# Patient Record
Sex: Female | Born: 1995 | Race: White | Hispanic: No | Marital: Single | Smoking: Never smoker
Health system: Southern US, Community
[De-identification: ages and names within clinical notes are randomized; demographics above are authoritative.]

## PROBLEM LIST (undated history)

## (undated) ENCOUNTER — Ambulatory Visit (HOSPITAL_COMMUNITY): Disposition: A | Discharge status: Home / Self Care | Payer: BLUE CROSS/BLUE SHIELD

---

## 2016-09-07 ENCOUNTER — Emergency Department (HOSPITAL_COMMUNITY)
Admission: EM | Admit: 2016-09-07 | Discharge: 2016-09-07 | Disposition: A | Discharge status: Home / Self Care | Payer: Self-pay | Attending: Emergency Medicine | Admitting: Emergency Medicine

## 2016-09-07 ENCOUNTER — Encounter (HOSPITAL_COMMUNITY): Payer: Self-pay | Admitting: Emergency Medicine

## 2016-09-07 DIAGNOSIS — H66004 Acute suppurative otitis media without spontaneous rupture of ear drum, recurrent, right ear: Secondary | ICD-10-CM | POA: Insufficient documentation

## 2016-09-07 DIAGNOSIS — J069 Acute upper respiratory infection, unspecified: Secondary | ICD-10-CM | POA: Insufficient documentation

## 2016-09-07 MED ORDER — AMOXICILLIN-POT CLAVULANATE 875-125 MG PO TABS
1.0000 | ORAL_TABLET | Freq: Once | ORAL | Status: AC
  Administered 2016-09-07: 1 via ORAL
  Filled 2016-09-07: qty 1

## 2016-09-07 MED ORDER — AMOXICILLIN-POT CLAVULANATE 875-125 MG PO TABS: 1.0000 | ORAL_TABLET | Freq: Two times a day (BID) | ORAL | 0 refills | Status: DC

## 2016-09-07 MED ORDER — PSEUDOEPHEDRINE HCL ER 120 MG PO TB12: 120.0000 mg | ORAL_TABLET | Freq: Two times a day (BID) | ORAL | Status: DC

## 2016-09-07 MED ORDER — OXYCODONE-ACETAMINOPHEN 5-325 MG PO TABS
1.0000 | ORAL_TABLET | Freq: Once | ORAL | Status: AC
  Administered 2016-09-07: 1 via ORAL
  Filled 2016-09-07: qty 1

## 2016-09-07 MED ORDER — OXYCODONE-ACETAMINOPHEN 5-325 MG PO TABS: 1.0000 | ORAL_TABLET | Freq: Four times a day (QID) | ORAL | 0 refills | Status: DC | PRN

## 2016-09-07 MED ORDER — PSEUDOEPHEDRINE HCL ER 120 MG PO TB12: 120.0000 mg | ORAL_TABLET | Freq: Two times a day (BID) | ORAL | 0 refills | Status: DC

## 2016-09-07 NOTE — Discharge Instructions (Signed)
Take the antibiotics as directed until all consumed  Take the decongestant for the next 2 days  You can use tylenol or Motin for mild to moderate pain and the Percocet for sever pain for the next several days

## 2016-09-07 NOTE — ED Provider Notes (Signed)
[facility]-EMERGENCY DEPT Provider Note   CSN: [id number] Arrival date & time: 09/07/16  0308     History   Chief Complaint Chief Complaint  Patient presents with  . Otalgia  . URI    HPI Brianna Holland is a 20 y.o. female.  Patient has had URI for the past week now with R ear pain that woke her from sleep Has Hx Otitis as an adult       History reviewed. No pertinent past medical history.  There are no active problems to display for this patient.   History reviewed. No pertinent surgical history.  OB History    No data available       Home Medications    Prior to Admission medications   Medication Sig Start Date End Date Taking? Authorizing Provider  amoxicillin-clavulanate (AUGMENTIN) 875-125 MG tablet Take 1 tablet by mouth every 12 (twelve) hours. 09/07/16   Earley FavorGail Teal Raben, NP  oxyCODONE-acetaminophen (PERCOCET/ROXICET) 5-325 MG tablet Take 1 tablet by mouth every 6 (six) hours as needed for severe pain. 09/07/16   Earley FavorGail Trenton Verne, NP  pseudoephedrine (SUDAFED 12 HOUR) 120 MG 12 hr tablet Take 1 tablet (120 mg total) by mouth 2 (two) times daily. 09/07/16   Earley FavorGail Syriah Delisi, NP    Family History No family history on file.  Social History Social History  Substance Use Topics  . Smoking status: Never Smoker  . Smokeless tobacco: Never Used  . Alcohol use No     Allergies   Review of patient's allergies indicates no known allergies.   Review of Systems Review of Systems  Constitutional: Negative for chills and fever.  HENT: Positive for congestion, ear pain and rhinorrhea. Negative for ear discharge and facial swelling.   Respiratory: Negative for cough.   All other systems reviewed and are negative.    Physical Exam Updated Vital Signs BP 142/85 (BP Location: Left Arm)   Pulse 70   Temp 98.7 F (37.1 C) (Oral)   Resp 18   SpO2 99%   Physical Exam  Constitutional: She appears well-developed and well-nourished.  HENT:  Head: Normocephalic.  Right Ear:  External ear normal. A middle ear effusion is present.  Left Ear: Hearing, tympanic membrane, external ear and ear canal normal.  Eyes: Pupils are equal, round, and reactive to light.  Neck: Normal range of motion.  Cardiovascular: Normal rate.   Pulmonary/Chest: Effort normal.  Musculoskeletal: Normal range of motion.  Lymphadenopathy:    She has cervical adenopathy.  Neurological: She is alert.  Skin: Skin is warm.  Psychiatric: She has a normal mood and affect.  Nursing note and vitals reviewed.    ED Treatments / Results  Labs (all labs ordered are listed, but only abnormal results are displayed) Labs Reviewed - No data to display  EKG  EKG Interpretation None       Radiology No results found.  Procedures Procedures (including critical care time)  Medications Ordered in ED Medications  pseudoephedrine (SUDAFED) 12 hr tablet 120 mg (not administered)  amoxicillin-clavulanate (AUGMENTIN) 875-125 MG per tablet 1 tablet (not administered)  oxyCODONE-acetaminophen (PERCOCET/ROXICET) 5-325 MG per tablet 1 tablet (not administered)     Initial Impression / Assessment and Plan / ED Course  I have reviewed the triage vital signs and the nursing notes.  Pertinent labs & imaging results that were available during my care of the patient were reviewed by me and considered in my medical decision making (see chart for details).  Clinical Course  Will treat with Augmentin Sudafed and 1 day of Percocet   Final Clinical Impressions(s) / ED Diagnoses   Final diagnoses:  Recurrent acute suppurative otitis media of right ear without spontaneous rupture of tympanic membrane  URI (upper respiratory infection)    New Prescriptions New Prescriptions   AMOXICILLIN-CLAVULANATE (AUGMENTIN) 875-125 MG TABLET    Take 1 tablet by mouth every 12 (twelve) hours.   OXYCODONE-ACETAMINOPHEN (PERCOCET/ROXICET) 5-325 MG TABLET    Take 1 tablet by mouth every 6 (six) hours as needed  for severe pain.   PSEUDOEPHEDRINE (SUDAFED 12 HOUR) 120 MG 12 HR TABLET    Take 1 tablet (120 mg total) by mouth 2 (two) times daily.     Earley Favor, NP 09/07/16 0352    Earley Favor, NP 09/07/16 1610    Cathren Laine, MD 09/07/16 [phone]

## 2016-09-07 NOTE — ED Triage Notes (Signed)
Patient here with complaints of right sided ear pain that woke her up out of sleep. Upper respiratory infection all week, otc medications no relief.

## 2017-03-19 ENCOUNTER — Ambulatory Visit (INDEPENDENT_AMBULATORY_CARE_PROVIDER_SITE_OTHER): Payer: BLUE CROSS/BLUE SHIELD | Admitting: Physician Assistant

## 2017-03-19 ENCOUNTER — Ambulatory Visit: Payer: Self-pay

## 2017-03-19 VITALS — BP 111/71 | HR 85 | Temp 98.1°F | Resp 16 | Wt 176.6 lb

## 2017-03-19 DIAGNOSIS — H6690 Otitis media, unspecified, unspecified ear: Secondary | ICD-10-CM

## 2017-03-19 DIAGNOSIS — R8299 Other abnormal findings in urine: Secondary | ICD-10-CM | POA: Diagnosis not present

## 2017-03-19 DIAGNOSIS — R42 Dizziness and giddiness: Secondary | ICD-10-CM

## 2017-03-19 DIAGNOSIS — J309 Allergic rhinitis, unspecified: Secondary | ICD-10-CM | POA: Diagnosis not present

## 2017-03-19 DIAGNOSIS — R82998 Other abnormal findings in urine: Secondary | ICD-10-CM

## 2017-03-19 LAB — POCT URINALYSIS DIP (MANUAL ENTRY)
BILIRUBIN UA: NEGATIVE
Bilirubin, UA: NEGATIVE
Blood, UA: NEGATIVE
Glucose, UA: NEGATIVE
NITRITE UA: NEGATIVE
PH UA: 7 (ref 5.0–8.0)
PROTEIN UA: NEGATIVE
Spec Grav, UA: 1.02 (ref 1.030–1.035)
UROBILINOGEN UA: 0.2 (ref ?–2.0)

## 2017-03-19 LAB — POCT CBC
Granulocyte percent: 75.9 %G (ref 37–80)
HEMATOCRIT: 36.4 % — AB (ref 37.7–47.9)
HEMOGLOBIN: 12.7 g/dL (ref 12.2–16.2)
LYMPH, POC: 1.7 (ref 0.6–3.4)
MCH, POC: 31.4 pg — AB (ref 27–31.2)
MCHC: 34.8 g/dL (ref 31.8–35.4)
MCV: 90.3 fL (ref 80–97)
MID (cbc): 0.6 (ref 0–0.9)
MPV: 7.6 fL (ref 0–99.8)
PLATELET COUNT, POC: 282 10*3/uL (ref 142–424)
POC Granulocyte: 7.2 — AB (ref 2–6.9)
POC LYMPH %: 18.3 % (ref 10–50)
POC MID %: 5.8 %M (ref 0–12)
RBC: 4.03 M/uL — AB (ref 4.04–5.48)
RDW, POC: 13.3 %
WBC: 9.5 10*3/uL (ref 4.6–10.2)

## 2017-03-19 LAB — POC MICROSCOPIC URINALYSIS (UMFC): MUCUS RE: ABSENT

## 2017-03-19 MED ORDER — AMOXICILLIN-POT CLAVULANATE 875-125 MG PO TABS: 1.0000 | ORAL_TABLET | Freq: Two times a day (BID) | ORAL | 0 refills | Status: DC

## 2017-03-19 MED ORDER — CETIRIZINE HCL 10 MG PO CAPS: 10.0000 mg | ORAL_CAPSULE | Freq: Every day | ORAL | 3 refills | Status: AC

## 2017-03-19 MED ORDER — FLUTICASONE PROPIONATE 50 MCG/ACT NA SUSP: 2.0000 | Freq: Every day | NASAL | 6 refills | Status: DC

## 2017-03-19 NOTE — Progress Notes (Signed)
MRN: [id number] DOB: 05/15/1996  Subjective:   Brianna Holland is a 21 y.o. healthy female presenting for chief complaint of Ear Pain (right ear pain and pressure x a week ) and Migraine (passed out in class ) .  Reports 3 day history of right ear pain. Has been dealing with bilateral ear pressure for the past month since she had a diagnosis of strep but notes the pressure got more severe in the right 3 days ago She would also like to discuss an episode where she felt like she was going to pass out after class.  Notes yesterday she got up from class and she felt sick on her stomach, she was dizzy, and then had a moment of blurred vision, nausea, palpitations, and ringing in her ears. After sitting for about 5 minutes, it completely resolved. She reports she had not drank or eaten a full meal prior to the event.  Has not had an episode since. She does have a headache now. Has not tried anything for relief of earache and headache.  She has history of seasonal allergies but is currently out of her allergy medication, no history of asthma. Has hx of ear infections. No hx of syncopal episodess Denies smoking. Pt notes she just had full blood work done at her gyn 1 month ago and her blood count, thyroid, and all other basic labs were completely normal. No FH of heart disease.  Review of Systems  Constitutional: Negative for chills, diaphoresis and fever.  HENT: Negative for sore throat.   Respiratory: Negative for cough and shortness of breath.   Cardiovascular: Negative for chest pain, palpitations and leg swelling.  Genitourinary: Positive for dysuria and frequency. Negative for flank pain, hematuria and urgency.  Neurological: Negative for tingling, sensory change, speech change, focal weakness and loss of consciousness.    Brianna Holland has a current medication list which includes the following prescription(s): norethindrone, amoxicillin-clavulanate, cetirizine hcl, and fluticasone. Also has No Known  Allergies.  Brianna Holland  has no past medical history on file. Also  has no past surgical history on file.     Social History   Social History  . Marital status: Single    Spouse name: N/A  . Number of children: N/A  . Years of education: N/A   Occupational History  . Not on file.   Social History Main Topics  . Smoking status: Never Smoker  . Smokeless tobacco: Never Used  . Alcohol use No  . Drug use: Unknown  . Sexual activity: Not on file   Other Topics Concern  . Not on file   Social History Narrative  . No narrative on file    Objective:   Vitals: BP 111/71   Pulse 85   Temp 98.1 F (36.7 C) (Oral)   Resp 16   Wt 176 lb 9.6 oz (80.1 kg)   LMP 02/28/2017   SpO2 100%   Physical Exam  Constitutional: She is oriented to person, place, and time. She appears well-developed and well-nourished. No distress.  HENT:  Head: Normocephalic and atraumatic.  Right Ear: External ear and ear canal normal. No mastoid tenderness. Tympanic membrane is erythematous and bulging.  Left Ear: External ear and ear canal normal. Tympanic membrane is not bulging. A middle ear effusion is present.  Nose: Mucosal edema (bilaterally) present. Right sinus exhibits no maxillary sinus tenderness and no frontal sinus tenderness. Left sinus exhibits no maxillary sinus tenderness and no frontal sinus tenderness.  Mouth/Throat: Uvula is midline,  oropharynx is clear and moist and mucous membranes are normal. No tonsillar exudate.  Eyes: Conjunctivae and EOM are normal. Pupils are equal, round, and reactive to light.  Neck: Normal range of motion.  Cardiovascular: Normal rate, regular rhythm and normal heart sounds.  Exam reveals no gallop and no friction rub.   No murmur heard. Pulmonary/Chest: Effort normal and breath sounds normal.  Neurological: She is alert and oriented to person, place, and time. She has normal strength. She displays no tremor. No cranial nerve deficit or sensory deficit. She  displays a negative Romberg sign. Coordination and gait normal.  Reflex Scores:      Tricep reflexes are 2+ on the right side and 2+ on the left side.      Bicep reflexes are 2+ on the right side and 2+ on the left side.      Brachioradialis reflexes are 2+ on the right side and 2+ on the left side.      Patellar reflexes are 2+ on the right side and 2+ on the left side.      Achilles reflexes are 2+ on the right side and 2+ on the left side. Normal FNF test.  Negative Dix Hallpike.   Skin: Skin is warm and dry.  Psychiatric: She has a normal mood and affect.  Vitals reviewed.  Orthostatic VS for the past 24 hrs:  BP- Lying Pulse- Lying BP- Sitting Pulse- Sitting BP- Standing at 0 minutes Pulse- Standing at 0 minutes  03/19/17 1552 115/72 84 119/75 103 119/77 101     Results for orders placed or performed in visit on 03/19/17 (from the past 24 hour(s))  POCT CBC     Status: Abnormal   Collection Time: 03/19/17  4:27 PM  Result Value Ref Range   WBC 9.5 4.6 - 10.2 K/uL   Lymph, poc 1.7 0.6 - 3.4   POC LYMPH PERCENT 18.3 10 - 50 %L   MID (cbc) 0.6 0 - 0.9   POC MID % 5.8 0 - 12 %M   POC Granulocyte 7.2 (A) 2 - 6.9   Granulocyte percent 75.9 37 - 80 %G   RBC 4.03 (A) 4.04 - 5.48 M/uL   Hemoglobin 12.7 12.2 - 16.2 g/dL   HCT, POC 40.9 (A) 81.1 - 47.9 %   MCV 90.3 80 - 97 fL   MCH, POC 31.4 (A) 27 - 31.2 pg   MCHC 34.8 31.8 - 35.4 g/dL   RDW, POC 91.4 %   Platelet Count, POC 282 142 - 424 K/uL   MPV 7.6 0 - 99.8 fL  POCT urinalysis dipstick     Status: Abnormal   Collection Time: 03/19/17  4:32 PM  Result Value Ref Range   Color, UA yellow yellow   Clarity, UA clear clear   Glucose, UA negative negative   Bilirubin, UA negative negative   Ketones, POC UA negative negative   Spec Grav, UA 1.020 1.030 - 1.035   Blood, UA negative negative   pH, UA 7.0 5.0 - 8.0   Protein Ur, POC negative negative   Urobilinogen, UA 0.2 Negative - 2.0   Nitrite, UA Negative Negative    Leukocytes, UA large (3+) (A) Negative  POCT Microscopic Urinalysis ([facility])     Status: Abnormal   Collection Time: 03/19/17  4:41 PM  Result Value Ref Range   WBC,UR,HPF,POC Few (A) None WBC/hpf   RBC,UR,HPF,POC None None RBC/hpf   Bacteria Many (A) None, Too numerous to count  Mucus Absent Absent   Epithelial Cells, UR Per Microscopy Moderate (A) None, Too numerous to count cells/hpf   EKG shows NSR at 88 bpm. No acute ST or T wave changes.   Assessment and Plan :  1. Dizziness PE findings, EKG, and POC labs reassuring. Pt likely experienced near syncopal episode due to mix of ear pressure, dehydration, and empty stomach. Instructed to return back to clinic if she experiences another one of these episodes for further evaluation. Encouraged to drink at least 64 oz of water daily and eat at least 3 meals throughout the day. Given strict ED/return precautions.  - Orthostatic vital signs - EKG 12-Lead - POCT CBC - POCT urinalysis dipstick - POCT Microscopic Urinalysis ([facility])  2. Acute otitis media, unspecified otitis media type - amoxicillin-clavulanate (AUGMENTIN) 875-125 MG tablet; Take 1 tablet by mouth 2 (two) times daily.  Dispense: 20 tablet; Refill: 0  3. Leukocytes in urine Urine culture pending. May have to change/add antibiotic to pt's regimen depending on urine culture results.  - Urine culture  4. Allergic rhinitis, unspecified chronicity, unspecified seasonality, unspecified trigger - Cetirizine HCl (ZYRTEC ALLERGY) 10 MG CAPS; Take 1 capsule (10 mg total) by mouth daily.  Dispense: 90 capsule; Refill: 3 - fluticasone (FLONASE) 50 MCG/ACT nasal spray; Place 2 sprays into both nostrils daily.  Dispense: 16 g; Refill: 6  Benjiman Core, PA-C  [facility] 03/19/2017 4:43 PM

## 2017-03-19 NOTE — Patient Instructions (Addendum)
Your physical exam findings are reassuring today. It appears you are having an ear infection which is likely the root cause for your symptoms. For ear infection, I would like you to take augmentin for the next ten days. Also, you should use flonase and zyrtec daily to help reduce the amount of fluid in your ear. I also think your episode yesterday could have partially been related to this and being slightly dehydrated/not having had eaten a real meal. Please make sure you are getting in at least 64 oz of water a day. Also, try to eat at least 3 meals daily. If you have any other episodes like this please return immediately or go to the ED.Thank you for letting me participate in your health and well being.   Your labs also suggest you may be having a UTI as well. I have sent off a culture and will call you in the next couple days in case the antibiotic I am prescribing for your ear infection does not cover the urine organism.    Near-Syncope Near-syncope is when you suddenly get weak or dizzy, or you feel like you might pass out (faint). During an episode of near-syncope, you may:  Feel dizzy or light-headed.  Feel sick to your stomach (nauseous).  See all white or all black.  Have cold, clammy skin. If you passed out, get help right away.Call your local emergency services (911 in the [place] Do not drive yourself to the hospital. Follow these instructions at home: Pay attention to any changes in your symptoms. Take these actions to help with your condition:  Have someone stay with you until you feel stable.  Do not drive, use machinery, or play sports until your doctor says it is okay.  Keep all follow-up visits as told by your doctor. This is important.  If you start to feel like you might pass out, lie down right away and raise (elevate) your feet above the level of your heart. Breathe deeply and steadily. Wait until all of the symptoms are gone.  Drink enough fluid to keep your pee  (urine) clear or pale yellow.  If you are taking blood pressure or heart medicine, get up slowly and spend many minutes getting ready to sit and then stand. This can help with dizziness.  Take over-the-counter and prescription medicines only as told by your doctor. Get help right away if:  You have a very bad headache.  You have unusual pain in your chest, tummy, or back.  You are bleeding from your mouth or rectum.  You have black or tarry poop (stool).  You have a very fast or uneven heartbeat (palpitations).  You pass out one time or more than once.  You have jerky movements that you cannot control (seizure).  You are confused.  You have trouble walking.  You are very weak.  You have vision problems. These symptoms may be an emergency. Do not wait to see if the symptoms will go away. Get medical help right away. Call your local emergency services (911 in the [place] Do not drive yourself to the hospital. This information is not intended to replace advice given to you by your health care provider. Make sure you discuss any questions you have with your health care provider. Document Released: 05/19/2008 Document Revised: 05/08/2016 Document Reviewed: 08/15/2015 Elsevier Interactive Patient Education  2017 [employer]  Otitis Media, Adult Otitis media is redness, soreness, and puffiness (swelling) in the space just behind your eardrum (middle ear).  It may be caused by allergies or infection. It often happens along with a cold. Follow these instructions at home:  Take your medicine as told. Finish it even if you start to feel better.  Only take over-the-counter or prescription medicines for pain, discomfort, or fever as told by your doctor.  Follow up with your doctor as told. Contact a doctor if:  You have otitis media only in one ear, or bleeding from your nose, or both.  You notice a lump on your neck.  You are not getting better in 3-5 days.  You feel worse  instead of better. Get help right away if:  You have pain that is not helped with medicine.  You have puffiness, redness, or pain around your ear.  You get a stiff neck.  You cannot move part of your face (paralysis).  You notice that the bone behind your ear hurts when you touch it. This information is not intended to replace advice given to you by your health care provider. Make sure you discuss any questions you have with your health care provider. Document Released: 05/19/2008 Document Revised: 05/08/2016 Document Reviewed: 06/28/2013 Elsevier Interactive Patient Education  2017 [employer].    IF you received an x-ray today, you will receive an invoice from [facility]. Please contact [facility] at [phone] with questions or concerns regarding your invoice.   IF you received labwork today, you will receive an invoice from [facility]. Please contact [facility] at [phone] with questions or concerns regarding your invoice.   Our billing staff will not be able to assist you with questions regarding bills from these companies.  You will be contacted with the lab results as soon as they are available. The fastest way to get your results is to activate your My Chart account. Instructions are located on the last page of this paperwork. If you have not heard from [place] regarding the results in 2 weeks, please contact this office.

## 2017-03-20 LAB — URINE CULTURE

## 2017-11-12 ENCOUNTER — Other Ambulatory Visit: Payer: Self-pay

## 2017-11-12 ENCOUNTER — Encounter (HOSPITAL_COMMUNITY): Payer: Self-pay | Admitting: Emergency Medicine

## 2017-11-12 DIAGNOSIS — E663 Overweight: Secondary | ICD-10-CM | POA: Diagnosis not present

## 2017-11-12 DIAGNOSIS — R740 Nonspecific elevation of levels of transaminase and lactic acid dehydrogenase [LDH]: Secondary | ICD-10-CM | POA: Insufficient documentation

## 2017-11-12 DIAGNOSIS — K8010 Calculus of gallbladder with chronic cholecystitis without obstruction: Secondary | ICD-10-CM | POA: Insufficient documentation

## 2017-11-12 DIAGNOSIS — Z8042 Family history of malignant neoplasm of prostate: Secondary | ICD-10-CM | POA: Diagnosis not present

## 2017-11-12 DIAGNOSIS — Z8379 Family history of other diseases of the digestive system: Secondary | ICD-10-CM | POA: Diagnosis not present

## 2017-11-12 DIAGNOSIS — Z793 Long term (current) use of hormonal contraceptives: Secondary | ICD-10-CM | POA: Diagnosis not present

## 2017-11-12 DIAGNOSIS — Z6825 Body mass index (BMI) 25.0-25.9, adult: Secondary | ICD-10-CM | POA: Insufficient documentation

## 2017-11-12 DIAGNOSIS — K219 Gastro-esophageal reflux disease without esophagitis: Secondary | ICD-10-CM | POA: Diagnosis not present

## 2017-11-12 DIAGNOSIS — Z79899 Other long term (current) drug therapy: Secondary | ICD-10-CM | POA: Diagnosis not present

## 2017-11-12 DIAGNOSIS — K8020 Calculus of gallbladder without cholecystitis without obstruction: Secondary | ICD-10-CM | POA: Diagnosis present

## 2017-11-12 DIAGNOSIS — Z8249 Family history of ischemic heart disease and other diseases of the circulatory system: Secondary | ICD-10-CM | POA: Diagnosis not present

## 2017-11-12 DIAGNOSIS — K838 Other specified diseases of biliary tract: Secondary | ICD-10-CM | POA: Diagnosis present

## 2017-11-12 LAB — CBC
HCT: 38.1 % (ref 36.0–46.0)
Hemoglobin: 12.8 g/dL (ref 12.0–15.0)
MCH: 30.3 pg (ref 26.0–34.0)
MCHC: 33.6 g/dL (ref 30.0–36.0)
MCV: 90.3 fL (ref 78.0–100.0)
PLATELETS: 267 10*3/uL (ref 150–400)
RBC: 4.22 MIL/uL (ref 3.87–5.11)
RDW: 12.2 % (ref 11.5–15.5)
WBC: 11.5 10*3/uL — AB (ref 4.0–10.5)

## 2017-11-12 LAB — COMPREHENSIVE METABOLIC PANEL
ALK PHOS: 74 U/L (ref 38–126)
ALT: 272 U/L — AB (ref 14–54)
AST: 395 U/L — ABNORMAL HIGH (ref 15–41)
Albumin: 4.2 g/dL (ref 3.5–5.0)
Anion gap: 6 (ref 5–15)
BUN: 13 mg/dL (ref 6–20)
CALCIUM: 9.1 mg/dL (ref 8.9–10.3)
CO2: 26 mmol/L (ref 22–32)
CREATININE: 0.66 mg/dL (ref 0.44–1.00)
Chloride: 104 mmol/L (ref 101–111)
Glucose, Bld: 115 mg/dL — ABNORMAL HIGH (ref 65–99)
Potassium: 3.6 mmol/L (ref 3.5–5.1)
Sodium: 136 mmol/L (ref 135–145)
TOTAL PROTEIN: 7.3 g/dL (ref 6.5–8.1)
Total Bilirubin: 1.7 mg/dL — ABNORMAL HIGH (ref 0.3–1.2)

## 2017-11-12 LAB — I-STAT BETA HCG BLOOD, ED (MC, WL, AP ONLY)

## 2017-11-12 LAB — LIPASE, BLOOD: LIPASE: 22 U/L (ref 11–51)

## 2017-11-12 NOTE — ED Triage Notes (Signed)
Pt is c/o epigastric pain that radiates through to her back between her shoulder blades Pt states it started about 2 pm today  Pt states she ate a salad for lunch  Pt states she has had the same in the past but it has not lasted this long  Pt states the pain is worse with movement and cough

## 2017-11-13 ENCOUNTER — Inpatient Hospital Stay (HOSPITAL_COMMUNITY): Payer: BLUE CROSS/BLUE SHIELD

## 2017-11-13 ENCOUNTER — Encounter (HOSPITAL_COMMUNITY)
Admission: EM | Disposition: A | Discharge status: Home / Self Care | Payer: Self-pay | Source: Home / Self Care | Attending: Emergency Medicine

## 2017-11-13 ENCOUNTER — Inpatient Hospital Stay (HOSPITAL_COMMUNITY): Payer: BLUE CROSS/BLUE SHIELD | Admitting: Registered Nurse

## 2017-11-13 ENCOUNTER — Ambulatory Visit (HOSPITAL_COMMUNITY)
Admission: EM | Admit: 2017-11-13 | Discharge: 2017-11-14 | Disposition: A | Discharge status: Home / Self Care | Payer: BLUE CROSS/BLUE SHIELD | Attending: Surgery | Admitting: Surgery

## 2017-11-13 ENCOUNTER — Emergency Department (HOSPITAL_COMMUNITY): Payer: BLUE CROSS/BLUE SHIELD

## 2017-11-13 DIAGNOSIS — K810 Acute cholecystitis: Secondary | ICD-10-CM | POA: Diagnosis not present

## 2017-11-13 DIAGNOSIS — R945 Abnormal results of liver function studies: Secondary | ICD-10-CM | POA: Diagnosis not present

## 2017-11-13 DIAGNOSIS — R935 Abnormal findings on diagnostic imaging of other abdominal regions, including retroperitoneum: Secondary | ICD-10-CM

## 2017-11-13 DIAGNOSIS — K8050 Calculus of bile duct without cholangitis or cholecystitis without obstruction: Secondary | ICD-10-CM

## 2017-11-13 DIAGNOSIS — K838 Other specified diseases of biliary tract: Secondary | ICD-10-CM

## 2017-11-13 DIAGNOSIS — R7401 Elevation of levels of liver transaminase levels: Secondary | ICD-10-CM | POA: Diagnosis present

## 2017-11-13 DIAGNOSIS — E663 Overweight: Secondary | ICD-10-CM | POA: Diagnosis present

## 2017-11-13 DIAGNOSIS — K219 Gastro-esophageal reflux disease without esophagitis: Secondary | ICD-10-CM | POA: Diagnosis not present

## 2017-11-13 DIAGNOSIS — R740 Nonspecific elevation of levels of transaminase and lactic acid dehydrogenase [LDH]: Secondary | ICD-10-CM

## 2017-11-13 DIAGNOSIS — K8020 Calculus of gallbladder without cholecystitis without obstruction: Secondary | ICD-10-CM

## 2017-11-13 DIAGNOSIS — R1013 Epigastric pain: Secondary | ICD-10-CM

## 2017-11-13 DIAGNOSIS — R109 Unspecified abdominal pain: Secondary | ICD-10-CM | POA: Diagnosis present

## 2017-11-13 HISTORY — PX: CHOLECYSTECTOMY: SHX55

## 2017-11-13 LAB — COMPREHENSIVE METABOLIC PANEL
ALBUMIN: 3.8 g/dL (ref 3.5–5.0)
ALT: 717 U/L — ABNORMAL HIGH (ref 14–54)
ANION GAP: 7 (ref 5–15)
AST: 728 U/L — ABNORMAL HIGH (ref 15–41)
Alkaline Phosphatase: 93 U/L (ref 38–126)
BILIRUBIN TOTAL: 2.5 mg/dL — AB (ref 0.3–1.2)
BUN: 9 mg/dL (ref 6–20)
CO2: 25 mmol/L (ref 22–32)
Calcium: 8.6 mg/dL — ABNORMAL LOW (ref 8.9–10.3)
Chloride: 107 mmol/L (ref 101–111)
Creatinine, Ser: 0.65 mg/dL (ref 0.44–1.00)
GFR calc non Af Amer: >60 mL/min (ref >60)
GLUCOSE: 86 mg/dL (ref 65–99)
POTASSIUM: 3.6 mmol/L (ref 3.5–5.1)
SODIUM: 139 mmol/L (ref 135–145)
TOTAL PROTEIN: 6.7 g/dL (ref 6.5–8.1)

## 2017-11-13 LAB — URINALYSIS, ROUTINE W REFLEX MICROSCOPIC
Bilirubin Urine: NEGATIVE
Glucose, UA: NEGATIVE mg/dL
Hgb urine dipstick: NEGATIVE
KETONES UR: NEGATIVE mg/dL
LEUKOCYTES UA: NEGATIVE
NITRITE: NEGATIVE
PH: 6 (ref 5.0–8.0)
Protein, ur: NEGATIVE mg/dL
SPECIFIC GRAVITY, URINE: 1.012 (ref 1.005–1.030)

## 2017-11-13 SURGERY — LAPAROSCOPIC CHOLECYSTECTOMY
Anesthesia: General

## 2017-11-13 MED ORDER — MIDAZOLAM HCL 5 MG/5ML IJ SOLN
INTRAMUSCULAR | Status: DC | PRN
  Administered 2017-11-13: 2 mg via INTRAVENOUS

## 2017-11-13 MED ORDER — FENTANYL CITRATE (PF) 100 MCG/2ML IJ SOLN
INTRAMUSCULAR | Status: AC
  Filled 2017-11-13: qty 4

## 2017-11-13 MED ORDER — HYDROMORPHONE HCL 1 MG/ML IJ SOLN
0.2500 mg | INTRAMUSCULAR | Status: DC | PRN
  Administered 2017-11-13 (×2): 0.5 mg via INTRAVENOUS

## 2017-11-13 MED ORDER — LIDOCAINE 2% (20 MG/ML) 5 ML SYRINGE
INTRAMUSCULAR | Status: AC
  Filled 2017-11-13: qty 5

## 2017-11-13 MED ORDER — FENTANYL CITRATE (PF) 250 MCG/5ML IJ SOLN
INTRAMUSCULAR | Status: AC
Start: 2017-11-13 — End: 2017-11-13
  Filled 2017-11-13: qty 5

## 2017-11-13 MED ORDER — HYDROCODONE-ACETAMINOPHEN 5-325 MG PO TABS
1.0000 | ORAL_TABLET | ORAL | Status: DC | PRN
Start: 2017-11-13 — End: 2017-11-14
  Administered 2017-11-14: 1 via ORAL
  Filled 2017-11-13: qty 1

## 2017-11-13 MED ORDER — DEXTROSE 5 % IV SOLN
INTRAVENOUS | Status: AC
  Filled 2017-11-13: qty 10

## 2017-11-13 MED ORDER — MORPHINE SULFATE (PF) 2 MG/ML IV SOLN
2.0000 mg | INTRAVENOUS | Status: DC | PRN
  Administered 2017-11-13 – 2017-11-14 (×3): 2 mg via INTRAVENOUS
  Filled 2017-11-13 (×3): qty 1

## 2017-11-13 MED ORDER — ROCURONIUM BROMIDE 50 MG/5ML IV SOSY
PREFILLED_SYRINGE | INTRAVENOUS | Status: AC
  Filled 2017-11-13: qty 5

## 2017-11-13 MED ORDER — LACTATED RINGERS IR SOLN
Status: DC | PRN
  Administered 2017-11-13: 1000 mL

## 2017-11-13 MED ORDER — FENTANYL CITRATE (PF) 100 MCG/2ML IJ SOLN
INTRAMUSCULAR | Status: DC | PRN
  Administered 2017-11-13 (×5): 50 ug via INTRAVENOUS

## 2017-11-13 MED ORDER — DEXTROSE 5 % IV SOLN
INTRAVENOUS | Status: AC
  Filled 2017-11-13: qty 2

## 2017-11-13 MED ORDER — ONDANSETRON HCL 4 MG/2ML IJ SOLN
INTRAMUSCULAR | Status: AC
  Filled 2017-11-13: qty 2

## 2017-11-13 MED ORDER — PROPOFOL 10 MG/ML IV BOLUS
INTRAVENOUS | Status: AC
  Filled 2017-11-13: qty 20

## 2017-11-13 MED ORDER — MIDAZOLAM HCL 2 MG/2ML IJ SOLN
INTRAMUSCULAR | Status: AC
  Filled 2017-11-13: qty 2

## 2017-11-13 MED ORDER — ONDANSETRON HCL 4 MG/2ML IJ SOLN: 4.0000 mg | Freq: Three times a day (TID) | INTRAMUSCULAR | Status: DC | PRN

## 2017-11-13 MED ORDER — DEXAMETHASONE SODIUM PHOSPHATE 10 MG/ML IJ SOLN
INTRAMUSCULAR | Status: DC | PRN
  Administered 2017-11-13: 10 mg via INTRAVENOUS

## 2017-11-13 MED ORDER — SODIUM CHLORIDE 0.9 % IV SOLN
INTRAVENOUS | Status: DC
  Administered 2017-11-13: 07:00:00 via INTRAVENOUS

## 2017-11-13 MED ORDER — BUPIVACAINE-EPINEPHRINE 0.25% -1:200000 IJ SOLN
INTRAMUSCULAR | Status: AC
  Filled 2017-11-13: qty 1

## 2017-11-13 MED ORDER — HYDROMORPHONE HCL 1 MG/ML IJ SOLN
INTRAMUSCULAR | Status: AC
  Filled 2017-11-13: qty 1

## 2017-11-13 MED ORDER — BUPIVACAINE-EPINEPHRINE 0.25% -1:200000 IJ SOLN
INTRAMUSCULAR | Status: DC | PRN
  Administered 2017-11-13: 50 mL

## 2017-11-13 MED ORDER — ONDANSETRON HCL 4 MG/2ML IJ SOLN
INTRAMUSCULAR | Status: DC | PRN
  Administered 2017-11-13: 4 mg via INTRAVENOUS

## 2017-11-13 MED ORDER — LACTATED RINGERS IV SOLN
INTRAVENOUS | Status: DC | PRN
  Administered 2017-11-13 (×2): via INTRAVENOUS

## 2017-11-13 MED ORDER — IOPAMIDOL (ISOVUE-300) INJECTION 61%
INTRAVENOUS | Status: AC
  Filled 2017-11-13: qty 50

## 2017-11-13 MED ORDER — FENTANYL CITRATE (PF) 100 MCG/2ML IJ SOLN
25.0000 ug | INTRAMUSCULAR | Status: DC | PRN
Start: 2017-11-13 — End: 2017-11-13
  Administered 2017-11-13: 50 ug via INTRAVENOUS
  Administered 2017-11-13 (×2): 25 ug via INTRAVENOUS

## 2017-11-13 MED ORDER — OXYCODONE HCL 5 MG PO TABS: 5.0000 mg | ORAL_TABLET | Freq: Once | ORAL | Status: DC | PRN

## 2017-11-13 MED ORDER — ONDANSETRON HCL 4 MG/2ML IJ SOLN
4.0000 mg | Freq: Once | INTRAMUSCULAR | Status: AC
  Administered 2017-11-13: 4 mg via INTRAVENOUS
  Filled 2017-11-13: qty 2

## 2017-11-13 MED ORDER — ROCURONIUM BROMIDE 10 MG/ML (PF) SYRINGE
PREFILLED_SYRINGE | INTRAVENOUS | Status: DC | PRN
  Administered 2017-11-13: 50 mg via INTRAVENOUS

## 2017-11-13 MED ORDER — MELATONIN 3 MG PO TABS: 3.0000 mg | ORAL_TABLET | Freq: Every evening | ORAL | Status: DC | PRN

## 2017-11-13 MED ORDER — EPHEDRINE SULFATE-NACL 50-0.9 MG/10ML-% IV SOSY
PREFILLED_SYRINGE | INTRAVENOUS | Status: DC | PRN
  Administered 2017-11-13: 5 mg via INTRAVENOUS

## 2017-11-13 MED ORDER — MORPHINE SULFATE (PF) 4 MG/ML IV SOLN
4.0000 mg | Freq: Once | INTRAVENOUS | Status: AC
  Administered 2017-11-13: 4 mg via INTRAVENOUS
  Filled 2017-11-13: qty 1

## 2017-11-13 MED ORDER — DEXTROSE 5 % IV SOLN
1.0000 g | INTRAVENOUS | Status: DC
  Administered 2017-11-13: 1 g via INTRAVENOUS
  Filled 2017-11-13 (×2): qty 10

## 2017-11-13 MED ORDER — OXYCODONE HCL 5 MG/5ML PO SOLN
5.0000 mg | Freq: Once | ORAL | Status: DC | PRN
  Filled 2017-11-13: qty 5

## 2017-11-13 MED ORDER — PROPOFOL 10 MG/ML IV BOLUS
INTRAVENOUS | Status: DC | PRN
  Administered 2017-11-13: 170 mg via INTRAVENOUS

## 2017-11-13 MED ORDER — LIDOCAINE 2% (20 MG/ML) 5 ML SYRINGE
INTRAMUSCULAR | Status: DC | PRN
  Administered 2017-11-13: 100 mg via INTRAVENOUS

## 2017-11-13 MED ORDER — 0.9 % SODIUM CHLORIDE (POUR BTL) OPTIME
TOPICAL | Status: DC | PRN
  Administered 2017-11-13: 1000 mL

## 2017-11-13 MED ORDER — DEXAMETHASONE SODIUM PHOSPHATE 10 MG/ML IJ SOLN
INTRAMUSCULAR | Status: AC
  Filled 2017-11-13: qty 1

## 2017-11-13 SURGICAL SUPPLY — 43 items
APPLIER CLIP ROT 10 11.4 M/L (STAPLE)
BANDAGE ADH SHEER 1  50/CT (GAUZE/BANDAGES/DRESSINGS) ×10 IMPLANT
BENZOIN TINCTURE PRP APPL 2/3 (GAUZE/BANDAGES/DRESSINGS) ×2 IMPLANT
CABLE HIGH FREQUENCY MONO STRZ (ELECTRODE) ×2 IMPLANT
CATH CHOLANG 76X19 KUMAR (CATHETERS) IMPLANT
CHLORAPREP W/TINT 26ML (MISCELLANEOUS) ×2 IMPLANT
CLIP APPLIE ROT 10 11.4 M/L (STAPLE) IMPLANT
CLIP VESOLOCK LG 6/CT PURPLE (CLIP) ×2 IMPLANT
CLIP VESOLOCK XL 6/CT (CLIP) IMPLANT
COVER MAYO STAND STRL (DRAPES) IMPLANT
COVER SURGICAL LIGHT HANDLE (MISCELLANEOUS) ×2 IMPLANT
DECANTER SPIKE VIAL GLASS SM (MISCELLANEOUS) ×2 IMPLANT
DERMABOND ADVANCED (GAUZE/BANDAGES/DRESSINGS) ×1
DERMABOND ADVANCED .7 DNX12 (GAUZE/BANDAGES/DRESSINGS) ×1 IMPLANT
DRAIN CHANNEL 19F RND (DRAIN) IMPLANT
DRAPE C-ARM 42X120 X-RAY (DRAPES) IMPLANT
EVACUATOR SILICONE 100CC (DRAIN) IMPLANT
GLOVE BIOGEL PI IND STRL 7.0 (GLOVE) ×1 IMPLANT
GLOVE BIOGEL PI INDICATOR 7.0 (GLOVE) ×1
GLOVE SURG SS PI 7.0 STRL IVOR (GLOVE) ×2 IMPLANT
GOWN STRL REUS W/TWL LRG LVL3 (GOWN DISPOSABLE) ×2 IMPLANT
GOWN STRL REUS W/TWL XL LVL3 (GOWN DISPOSABLE) ×4 IMPLANT
GRASPER SUT TROCAR 14GX15 (MISCELLANEOUS) ×2 IMPLANT
IRRIG SUCT STRYKERFLOW 2 WTIP (MISCELLANEOUS)
IRRIGATION SUCT STRKRFLW 2 WTP (MISCELLANEOUS) IMPLANT
KIT BASIN OR (CUSTOM PROCEDURE TRAY) ×2 IMPLANT
POUCH RETRIEVAL ECOSAC 10 (ENDOMECHANICALS) ×1 IMPLANT
POUCH RETRIEVAL ECOSAC 10MM (ENDOMECHANICALS) ×1
SCISSORS LAP 5X35 DISP (ENDOMECHANICALS) ×2 IMPLANT
SET IRRIG TUBING LAPAROSCOPIC (IRRIGATION / IRRIGATOR) ×2 IMPLANT
SHEARS HARMONIC ACE PLUS 36CM (ENDOMECHANICALS) IMPLANT
SLEEVE XCEL OPT CAN 5 100 (ENDOMECHANICALS) ×4 IMPLANT
STOPCOCK 4 WAY LG BORE MALE ST (IV SETS) IMPLANT
STRIP CLOSURE SKIN 1/2X4 (GAUZE/BANDAGES/DRESSINGS) ×2 IMPLANT
SUT ETHILON 2 0 PS N (SUTURE) IMPLANT
SUT MNCRL AB 4-0 PS2 18 (SUTURE) ×2 IMPLANT
SUT VICRYL 0 ENDOLOOP (SUTURE) IMPLANT
TOWEL OR 17X26 10 PK STRL BLUE (TOWEL DISPOSABLE) ×2 IMPLANT
TOWEL OR NON WOVEN STRL DISP B (DISPOSABLE) ×2 IMPLANT
TRAY LAPAROSCOPIC (CUSTOM PROCEDURE TRAY) ×2 IMPLANT
TROCAR BLADELESS OPT 5 100 (ENDOMECHANICALS) ×2 IMPLANT
TROCAR XCEL 12X100 BLDLESS (ENDOMECHANICALS) ×2 IMPLANT
TUBING INSUF HEATED (TUBING) ×2 IMPLANT

## 2017-11-13 NOTE — ED Provider Notes (Signed)
[facility]-EMERGENCY DEPT Provider Note   CSN: [id number] Arrival date & time: 11/12/17  2126     History   Chief Complaint Chief Complaint  Patient presents with  . Abdominal Pain    HPI Brianna Holland is a 21 y.o. female.  The history is provided by the patient and medical records.  Abdominal Pain   Associated symptoms include nausea and vomiting.    21 year old female with no significant past medical history presenting to the ED for abdominal pain.  Patient reports she had a salad today and felt like "it did not die just right".  States she made herself vomit about 6 times in an attempt to make herself feel better.  States she did take some aspirin and her dad gave her some antacids without relief.  States pain is in epigastric area and in her back.  Reports some continued nausea.  She has not had any diarrhea.  No prior abdominal surgeries.  States she has noticed some pain with eating so has been trying to modify her diet (eliminating fatty/greasy foods, etc).  Has never had evaluation for this in the past.  History reviewed. No pertinent past medical history.  There are no active problems to display for this patient.   History reviewed. No pertinent surgical history.  OB History    No data available       Home Medications    Prior to Admission medications   Medication Sig Start Date End Date Taking? Authorizing Provider  Cetirizine HCl (ZYRTEC ALLERGY) 10 MG CAPS Take 1 capsule (10 mg total) by mouth daily. 03/19/17  Yes Barnett AbuWiseman, [place] D, PA-C  MELATONIN PO Take 1 tablet by mouth at bedtime as needed (sleep).   Yes [provider]  norethindrone (MICRONOR,CAMILA,ERRIN) 0.35 MG tablet Take 1 tablet by mouth daily.   Yes [provider]    Family History Family History  Problem Relation Age of Onset  . Cancer Father        Prostate  . Hypertension Father     Social History Social History   Tobacco Use  . Smoking  status: Never Smoker  . Smokeless tobacco: Never Used  Substance Use Topics  . Alcohol use: No  . Drug use: No     Allergies   Patient has no known allergies.   Review of Systems Review of Systems  Gastrointestinal: Positive for abdominal pain, nausea and vomiting.  All other systems reviewed and are negative.    Physical Exam Updated Vital Signs BP 117/69 (BP Location: Left Arm)   Pulse 75   Temp 98.7 F (37.1 C) (Oral)   Resp 18   LMP 10/08/2017 (Exact Date)   SpO2 100%   Physical Exam  Constitutional: She is oriented to person, place, and time. She appears well-developed and well-nourished.  HENT:  Head: Normocephalic and atraumatic.  Mouth/Throat: Oropharynx is clear and moist.  Eyes: Conjunctivae and EOM are normal. Pupils are equal, round, and reactive to light.  Neck: Normal range of motion.  Cardiovascular: Normal rate, regular rhythm and normal heart sounds.  Pulmonary/Chest: Effort normal and breath sounds normal.  Abdominal: Soft. Bowel sounds are normal. She exhibits no pulsatile liver and no ascites. There is tenderness in the right upper quadrant and epigastric area. There is no rigidity and no guarding.  Epigastric and right upper quadrant tenderness with some radiation into the right flank, no peritoneal signs, normal bowel sounds  Musculoskeletal: Normal range of motion.  Neurological: She is  alert and oriented to person, place, and time.  Skin: Skin is warm and dry.  Psychiatric: She has a normal mood and affect.  Nursing note and vitals reviewed.    ED Treatments / Results  Labs (all labs ordered are listed, but only abnormal results are displayed) Labs Reviewed  COMPREHENSIVE METABOLIC PANEL - Abnormal; Notable for the following components:      Result Value   Glucose, Bld 115 (*)    AST 395 (*)    ALT 272 (*)    Total Bilirubin 1.7 (*)    All other components within normal limits  CBC - Abnormal; Notable for the following components:    WBC 11.5 (*)    All other components within normal limits  LIPASE, BLOOD  URINALYSIS, ROUTINE W REFLEX MICROSCOPIC  I-STAT BETA HCG BLOOD, ED ([facility], [facility], [facility] ONLY)    EKG  EKG Interpretation None       Radiology Koreas Abdomen Complete  Result Date: 11/13/2017 CLINICAL DATA:  Intermittent RIGHT upper quadrant pain for months. Elevated liver function tests. EXAM: ABDOMEN ULTRASOUND COMPLETE COMPARISON:  None. FINDINGS: Gallbladder: Multiple echogenic gallstones measuring to 14 mm with acoustic shadowing. No gallbladder wall thickening or pericholecystic fluid. No sonographic Murphy's sign elicited. Common bile duct: Diameter: 8 mm proximally, 5 mm distally. No sonographic findings of choledocholithiasis. Liver: No focal lesion identified. Within normal limits in parenchymal echogenicity. Portal vein is patent on color Doppler imaging with normal direction of blood flow towards the liver. IVC: No abnormality visualized. Pancreas: Visualized portion unremarkable. Spleen: Size and appearance within normal limits. Right Kidney: Length: 9.1 cm. Echogenicity within normal limits. No mass or hydronephrosis visualized. Left Kidney: Length: 9.8 cm. Echogenicity within normal limits. No mass or hydronephrosis visualized. Abdominal aorta: No aneurysm visualized. Other findings: None. IMPRESSION: 1. Cholelithiasis without sonographic findings of acute cholecystitis. 2. Dilated Common bile duct 8 mm proximally, associated with recently passed stones. Electronically Signed   By: Awilda Metroourtnay  Bloomer M.D.   On: 11/13/2017 04:33    Procedures Procedures (including critical care time)  Medications Ordered in ED Medications  morphine 4 MG/ML injection 4 mg (4 mg Intravenous Given 11/13/17 0424)  ondansetron (ZOFRAN) injection 4 mg (4 mg Intravenous Given 11/13/17 0331)     Initial Impression / Assessment and Plan / ED Course  I have reviewed the triage vital signs and the nursing notes.  Pertinent labs & imaging  results that were available during my care of the patient were reviewed by me and considered in my medical decision making (see chart for details).  21 year old female here with abdominal pain.  Reports this began after eating salad today.  Felt that she "did not digested right".  She is afebrile and nontoxic.  He does have some epigastric and mild right upper quadrant pain.  She also endorses pain straight through to her mid back.  Screening labs were obtained from triage with elevations of her AST and ALT at 395 and 272 respectively.  Bili also elevated at 1.7.  She denies any significant alcohol use, Tylenol intake, illicit drug use, or other risk factors for hepatitis C.  She has been vaccinated against hepatitis B. no recent travel out of the country, diarrhea.  She is not on any statin medications.  She does report that she has had some intermittent abdominal pain after eating for a while now and so has been modifying her diet.  She has never had this evaluated.  We will plan for abdominal ultrasound to  assess for gallstones.  After initial medications, patient feeling much better.  US shows cholelithiasis without findings of cholecystitis.  Dilated CBD up to 8mm without acute findings of choledocholithiasis-- question of recently passed stone.  Patient's overall appearance much improved.  Vitals remained stable.  She is otherwise healthy.  Will discuss with GI for recommendations of inpatient versus outpatient management.  Case discussed in detail with on-call GI physician, Dr. Rhea Belton-- appreciate his input.  Discussed options of inpatient versus outpatient management and risk/benefits of each (inpatient more rapid, outpatient can be quite lengthy)/  He recommends to discuss with patient-- he is ok with either option but patient will need to understand she was worsen clinically if following up OP.  He will see her in the hospital today if admitted, otherwise will see in clinic on Monday.   I have  discussed options with patient as outlined by Dr. Rhea Belton-- Patient initially wanted to go home, however after I discussed her options and risks, especially acutely worsening condition without ability to follow-up over the weekend, patient elected admission.  I feel like this is probably her safest option.  Will order MRCP.   Discussed with Dr. Clyde Lundborg-- accepts admission, temp orders placed.  Morning team to do full assessment.  Final Clinical Impressions(s) / ED Diagnoses   Final diagnoses:  Gallstones  Transaminitis  Common bile duct dilation    ED Discharge Orders    None       Garlon Hatchet, PA-C 11/13/17 0630    Zadie Rhine, MD 11/13/17 [id number]

## 2017-11-13 NOTE — Anesthesia Postprocedure Evaluation (Signed)
Anesthesia Post Note  Patient: Meriel FlavorsHolly Bellavance  Procedure(s) Performed: LAPAROSCOPIC CHOLECYSTECTOMY (N/A )     Patient location during evaluation: PACU Anesthesia Type: General Level of consciousness: awake and alert Pain management: pain level controlled Vital Signs Assessment: post-procedure vital signs reviewed and stable Respiratory status: spontaneous breathing, nonlabored ventilation, respiratory function stable and patient connected to nasal cannula oxygen Cardiovascular status: blood pressure returned to baseline and stable Postop Assessment: no apparent nausea or vomiting Anesthetic complications: no    Last Vitals:  Vitals:   11/13/17 1633 11/13/17 1730  BP: 124/86 121/80  Pulse: 70 (!) 55  Resp: 14 15  Temp: 37 C 37.1 C  SpO2: 100% 100%    Last Pain:  Vitals:   11/13/17 1730  TempSrc: Oral  PainSc:                  Taiana Temkin

## 2017-11-13 NOTE — H&P (Signed)
History and Physical  Brianna Holland WUJ:[id number] DOB: 06/22/1996 DOA: 11/13/2017  Referring physician:  Sharilyn Sites, ER PA PCP: Patient, No Pcp Per  Outpatient specialists: None Patient coming from: home & is able to ambulate without assistance  Chief Complaint: abdominal pain   HPI: Brianna Holland is a 21 y.o. female with medical history significant for GERD who for the past few months has been complaining of midepigastric and right upper quadrant abdominal pain which she feels goes all the way to her back. Her most recent episode started last night and then continued to get worse than usual to the point where it was a 10/10 she came into the emergency room. She also threw up approximately 6 times, almost always after eating. ED Course: in the emergency room, patient was noted to have mild transaminitis with an AST of 395 and ALT of 272. Alkaline phosphatase level was normal. White blood cell count minimally elevated at 11.5.  A right upper quadrant ultrasound noted cholelithiasis without sonographic findings of acute cholecystitis and also a dilated common bile duct associated with recently passed stones. Case was discussed with gastroenterology was consulted who recommended getting MRCP. Hospitalists were called for further evaluation and admission.   Follow-up labs several hours later noted worsening transaminitis with AST of 728 and ALT of 717.  bilirubin slightly increased from 1.7 up to 2.5. Formal consult by GI was done. MRCP was performed later that morning. MRCP noted a borderline distention of the common bile duct to 7 mm all of the common bile duct and head of the pancreas was normal at 3 mm. No obstructing stone noted. Again, cholelithiasis noted with these findings.  With these findings, general surgery who had been consulted by GI evaluated patient and plan to take her later today for laparoscopic cholecystectomy.  Review of Systems: Patient seafter arrival to floor . Pt complains  of feeling hungry. Also some mild right upper quadrant soreness   Pt denies anheadaches, vision changes, dysphagia, chest pain, palpitations, shortness of breath, wheeze, cough, abdominal pain, hematuria, dysuria, constipation, diarrhea, focal extremity numbness weakness or pain .  Review of systems are otherwise negative   History reviewed. No pertinent past medical history other than acid reflux. History reviewed. No pertinent surgical history.  Social History:  reports that  has never smoked. she has never used smokeless tobacco. She reports that she does not drink alcohol or use drugs.  when she is at school, she lives with 3 roommates. She ambulates without assistance.    No Known Allergies  Family History  Problem Relation Age of Onset  . Cancer Father        Prostate  . Hypertension Father    Mother and grandmother with history of gallstones status post cholecystectomy  Prior to Admission medications   Medication Sig Start Date End Date Taking? Authorizing Provider  Cetirizine HCl (ZYRTEC ALLERGY) 10 MG CAPS Take 1 capsule (10 mg total) by mouth daily. 03/19/17  Yes Barnett Abu, [place] D, PA-C  MELATONIN PO Take 1 tablet by mouth at bedtime as needed (sleep).   Yes [provider]  norethindrone (MICRONOR,CAMILA,ERRIN) 0.35 MG tablet Take 1 tablet by mouth daily.   Yes [provider]    Physical Exam: BP 105/62 (BP Location: Left Arm)   Pulse 60   Temp 97.7 F (36.5 C) (Oral)   Resp 16   Ht 5\' 4"  (1.626 m)   Wt 77.1 kg (170 lb)   LMP 10/08/2017 (Exact Date)   SpO2  100%   BMI 29.18 kg/m   General:  Alert and oriented 3, no acute distress  Eyes: sclera nonicteric, extraocular movements are intact  ENT:  Normocephalic, atraumatic, mucous membranes are slightly dry Neck: Supple, no JVD  Cardiovascular: regular rate and rhythm, S1-S2  Respiratory: clear auscultation bilaterally  Abdomen: soft, mild tenderness right upper quadrant, nondistended,  normoactive bowel sounds  Skin: No skin breaks, tears or lesions. Tattoo noted  Musculoskeletal: no clubbing or cyanosis or edema  Psychiatric: appropriate, no evidence of psychoses  Neurologic: no focal deficits           Labs on Admission:  Basic Metabolic Panel: Recent Labs  Lab 11/12/17 2221 11/13/17 0910  NA 136 139  K 3.6 3.6  CL 104 107  CO2 26 25  GLUCOSE 115* 86  BUN 13 9  CREATININE 0.66 0.65  CALCIUM 9.1 8.6*   Liver Function Tests: Recent Labs  Lab 11/12/17 2221 11/13/17 0910  AST 395* 728*  ALT 272* 717*  ALKPHOS 74 93  BILITOT 1.7* 2.5*  PROT 7.3 6.7  ALBUMIN 4.2 3.8   Recent Labs  Lab 11/12/17 2221  LIPASE 22   No results for input(s): AMMONIA in the last 168 hours. CBC: Recent Labs  Lab 11/12/17 2221  WBC 11.5*  HGB 12.8  HCT 38.1  MCV 90.3  PLT 267   Cardiac Enzymes: No results for input(s): CKTOTAL, CKMB, CKMBINDEX, TROPONINI in the last 168 hours.  BNP (last 3 results) No results for input(s): BNP in the last 8760 hours.  ProBNP (last 3 results) No results for input(s): PROBNP in the last 8760 hours.  CBG: No results for input(s): GLUCAP in the last 168 hours.  Radiological Exams on Admission: Koreas Abdomen Complete  Result Date: 11/13/2017 CLINICAL DATA:  Intermittent RIGHT upper quadrant pain for months. Elevated liver function tests. EXAM: ABDOMEN ULTRASOUND COMPLETE COMPARISON:  None. FINDINGS: Gallbladder: Multiple echogenic gallstones measuring to 14 mm with acoustic shadowing. No gallbladder wall thickening or pericholecystic fluid. No sonographic Murphy's sign elicited. Common bile duct: Diameter: 8 mm proximally, 5 mm distally. No sonographic findings of choledocholithiasis. Liver: No focal lesion identified. Within normal limits in parenchymal echogenicity. Portal vein is patent on color Doppler imaging with normal direction of blood flow towards the liver. IVC: No abnormality visualized. Pancreas: Visualized portion  unremarkable. Spleen: Size and appearance within normal limits. Right Kidney: Length: 9.1 cm. Echogenicity within normal limits. No mass or hydronephrosis visualized. Left Kidney: Length: 9.8 cm. Echogenicity within normal limits. No mass or hydronephrosis visualized. Abdominal aorta: No aneurysm visualized. Other findings: None. IMPRESSION: 1. Cholelithiasis without sonographic findings of acute cholecystitis. 2. Dilated Common bile duct 8 mm proximally, associated with recently passed stones. Electronically Signed   By: Awilda Metroourtnay  Bloomer M.D.   On: 11/13/2017 04:33   Mr Abdomen Mrcp Wo Contrast  Result Date: 11/13/2017 CLINICAL DATA:  Intermittent right upper quadrant pain with elevated LFTs. Cholelithiasis on ultrasound of biliary dilatation. EXAM: MRI ABDOMEN WITHOUT CONTRAST  (INCLUDING MRCP) TECHNIQUE: Multiplanar multisequence MR imaging of the abdomen was performed. Heavily T2-weighted images of the biliary and pancreatic ducts were obtained, and three-dimensional MRCP images were rendered by post processing. COMPARISON:  Ultrasound exam earlier the same day. FINDINGS: Lower chest:  Unremarkable. Hepatobiliary: Unremarkable noncontrast appearance. Gallbladder is distended with multiple stones evident ranging in size from 17 mm diameter down to stones in the fundal region measuring only a couple mm diameter. No gallbladder wall thickening or pericholecystic fluid. Slight prominence intrahepatic  bile ducts with extrahepatic common duct measuring 7 mm diameter. Common bile duct in the head of the pancreas measures 3 mm diameter. Pancreas: Unremarkable. Spleen: Normal. Adrenals/Urinary Tract: Adrenal glands unremarkable. Kidneys have normal noncontrast MR appearance. Stomach/Bowel: Stomach is nondistended. No gastric wall thickening. No evidence of outlet obstruction. Duodenum is normally positioned as is the ligament of Treitz. No small bowel or colonic dilatation within the visualized abdomen.  Vascular/Lymphatic: No abdominal aortic aneurysm. There is no gastrohepatic or hepatoduodenal ligament lymphadenopathy. No intraperitoneal or retroperitoneal lymphadenopathy. Other: No intraperitoneal free fluid. Musculoskeletal: No abnormal marrow signal within the visualized bony anatomy. IMPRESSION: 1. Cholelithiasis without choledocholithiasis. Common duct is borderline distended at 7 mm diameter although common bile duct diameter in the head of the pancreas is normal at 3 mm. Electronically Signed   By: Kennith Center M.D.   On: 11/13/2017 11:41   Mr 3d Recon At Scanner  Result Date: 11/13/2017 CLINICAL DATA:  Intermittent right upper quadrant pain with elevated LFTs. Cholelithiasis on ultrasound of biliary dilatation. EXAM: MRI ABDOMEN WITHOUT CONTRAST  (INCLUDING MRCP) TECHNIQUE: Multiplanar multisequence MR imaging of the abdomen was performed. Heavily T2-weighted images of the biliary and pancreatic ducts were obtained, and three-dimensional MRCP images were rendered by post processing. COMPARISON:  Ultrasound exam earlier the same day. FINDINGS: Lower chest:  Unremarkable. Hepatobiliary: Unremarkable noncontrast appearance. Gallbladder is distended with multiple stones evident ranging in size from 17 mm diameter down to stones in the fundal region measuring only a couple mm diameter. No gallbladder wall thickening or pericholecystic fluid. Slight prominence intrahepatic bile ducts with extrahepatic common duct measuring 7 mm diameter. Common bile duct in the head of the pancreas measures 3 mm diameter. Pancreas: Unremarkable. Spleen: Normal. Adrenals/Urinary Tract: Adrenal glands unremarkable. Kidneys have normal noncontrast MR appearance. Stomach/Bowel: Stomach is nondistended. No gastric wall thickening. No evidence of outlet obstruction. Duodenum is normally positioned as is the ligament of Treitz. No small bowel or colonic dilatation within the visualized abdomen. Vascular/Lymphatic: No abdominal  aortic aneurysm. There is no gastrohepatic or hepatoduodenal ligament lymphadenopathy. No intraperitoneal or retroperitoneal lymphadenopathy. Other: No intraperitoneal free fluid. Musculoskeletal: No abnormal marrow signal within the visualized bony anatomy. IMPRESSION: 1. Cholelithiasis without choledocholithiasis. Common duct is borderline distended at 7 mm diameter although common bile duct diameter in the head of the pancreas is normal at 3 mm. Electronically Signed   By: Kennith Center M.D.   On: 11/13/2017 11:41    EKG: Not done  Assessment/Plan Present on Admission: . Acute cholecystitis causing transaminitis, hyperbilirubinemia and abdominal pain: Common bile duct stone obstruction ruled out. Likely previously had stone which has since passed, confirmed by MRCP. 4 laparoscopic cholecystectomy later today. Discussed with general surgery and have requested they take over care following surgery. Marland Kitchen Overweight (BMI 25.0-29.9): Meets criteria with BMI greater than 25 . GERD (gastroesophageal reflux disease): Continue home medications once able to take by mouth  Principal Problem:   Acute cholecystitis Active Problems:   Abdominal pain   Transaminitis   Overweight (BMI 25.0-29.9)   GERD (gastroesophageal reflux disease)   DVT prophylaxis: SCDs   Code Status: Full code   Family Communication: Friend at the bedside   Disposition Plan: Surgery today, potential discharge this evening or tomorrow, dependent on general surgery   Consults called: Gen. surgery, [facility]   Admission status: Patient initially made inpatient given concerns for abdominal pain and workup    Hollice Espy MD [facility] Pager [phone]  If 7PM-7AM, please contact  night-coverage [email or web address] Password [email or web address]  11/13/2017, 12:42 PM

## 2017-11-13 NOTE — Consult Note (Signed)
[facility] Consult/Admission Note  Brianna Holland 11-19-1996  [id number].    Requesting MD: Dr. [place] Pink Chief Complaint/Reason for Consult: cholelithiasis   HPI:   Patient is an otherwise healthy 21 year old female who presented to the emergency department with complaints of abdominal pain, nausea and induced vomiting. Patient states she's been having intermittent severe, epigastric/chest pain radiating into her back since summer which has progressively worsened over the last 2 months. She can take a few aspirin and his pain usually goes away. Yesterday the pain did not resolve which prompted her to come to the ER. Patient states she induced vomiting due to nausea. She denies blood in her stool, light color stools, changes in bowel habits, dysuria, hematuria. She states associated shortness of breath with the epigastric/chest pain. Ultrasound showed cholelithiasis without findings of acute cholecystitis. Common bile duct dilatation 8 mm. AST and 728, ALT 717, total bilirubin 2.5. WBC 11.5. Afebrile. Patient states she does not smoke, use drugs, or drink alcohol. She has had no previous abdominal surgeries. She is not on a blood thinner.  ROS:  Review of Systems  Constitutional: Negative for chills and fever.  Respiratory: Positive for shortness of breath (with pain not at rest).   Cardiovascular: Positive for chest pain.  Gastrointestinal: Positive for abdominal pain, nausea and vomiting. Negative for blood in stool, constipation, diarrhea and melena.  Genitourinary: Negative for dysuria and hematuria.  Skin: Negative for rash.  Neurological: Negative for dizziness and loss of consciousness.  All other systems reviewed and are negative.    Family History  Problem Relation Age of Onset  . Cancer Father        Prostate  . Hypertension Father     History reviewed. No pertinent past medical history.  History reviewed. No pertinent surgical history.  Social History:   reports that  has never smoked. she has never used smokeless tobacco. She reports that she does not drink alcohol or use drugs.  Allergies: No Known Allergies  Medications Prior to Admission  Medication Sig Dispense Refill  . Cetirizine HCl (ZYRTEC ALLERGY) 10 MG CAPS Take 1 capsule (10 mg total) by mouth daily. 90 capsule 3  . MELATONIN PO Take 1 tablet by mouth at bedtime as needed (sleep).    . norethindrone (MICRONOR,CAMILA,ERRIN) 0.35 MG tablet Take 1 tablet by mouth daily.      Blood pressure 105/62, pulse 60, temperature 97.7 F (36.5 C), temperature source Oral, resp. rate 16, height 5' 4"  (1.626 m), weight 170 lb (77.1 kg), last menstrual period 10/08/2017, SpO2 100 %.  Physical Exam  Constitutional: She is oriented to person, place, and time and well-developed, well-nourished, and in no distress. Vital signs are normal. No distress.  HENT:  Head: Normocephalic and atraumatic.  Nose: Nose normal.  Mouth/Throat: Oropharynx is clear and moist. No oropharyngeal exudate.  Eyes: Conjunctivae are normal. Pupils are equal, round, and reactive to light. Right eye exhibits no discharge. Left eye exhibits no discharge. No scleral icterus.  Neck: Normal range of motion. Neck supple. No thyromegaly present.  Cardiovascular: Normal rate, regular rhythm, normal heart sounds and intact distal pulses. Exam reveals no gallop and no friction rub.  No murmur heard. Pulses:      Radial pulses are 2+ on the right side, and 2+ on the left side.       Dorsalis pedis pulses are 2+ on the right side, and 2+ on the left side.  Pulmonary/Chest: Effort normal and breath sounds normal. No respiratory distress. She  has no decreased breath sounds. She has no wheezes. She has no rhonchi. She has no rales.  Abdominal: Soft. Normal appearance and bowel sounds are normal. She exhibits no distension and no mass. There is no hepatosplenomegaly. There is tenderness (mild in epigastric region). There is no rebound and  no guarding.  Musculoskeletal: Normal range of motion. She exhibits no edema or deformity.  Lymphadenopathy:    She has no cervical adenopathy.  Neurological: She is alert and oriented to person, place, and time.  Skin: Skin is warm and dry. No rash noted. She is not diaphoretic.  Psychiatric: Mood and affect normal.  Nursing note and vitals reviewed.   Results for orders placed or performed during the hospital encounter of 11/13/17 (from the past 48 hour(s))  Lipase, blood     Status: None   Collection Time: 11/12/17 10:21 PM  Result Value Ref Range   Lipase 22 11 - 51 U/L  Comprehensive metabolic panel     Status: Abnormal   Collection Time: 11/12/17 10:21 PM  Result Value Ref Range   Sodium 136 135 - 145 mmol/L   Potassium 3.6 3.5 - 5.1 mmol/L   Chloride 104 101 - 111 mmol/L   CO2 26 22 - 32 mmol/L   Glucose, Bld 115 (H) 65 - 99 mg/dL   BUN 13 6 - 20 mg/dL   Creatinine, Ser 0.66 0.44 - 1.00 mg/dL   Calcium 9.1 8.9 - 10.3 mg/dL   Total Protein 7.3 6.5 - 8.1 g/dL   Albumin 4.2 3.5 - 5.0 g/dL   AST 395 (H) 15 - 41 U/L   ALT 272 (H) 14 - 54 U/L   Alkaline Phosphatase 74 38 - 126 U/L   Total Bilirubin 1.7 (H) 0.3 - 1.2 mg/dL   GFR calc non Af Amer >60 >60 mL/min   GFR calc Af Amer >60 >60 mL/min    Comment: (NOTE) The eGFR has been calculated using the CKD EPI equation. This calculation has not been validated in all clinical situations. eGFR's persistently <60 mL/min signify possible Chronic Kidney Disease.    Anion gap 6 5 - 15  CBC     Status: Abnormal   Collection Time: 11/12/17 10:21 PM  Result Value Ref Range   WBC 11.5 (H) 4.0 - 10.5 K/uL   RBC 4.22 3.87 - 5.11 MIL/uL   Hemoglobin 12.8 12.0 - 15.0 g/dL   HCT 38.1 36.0 - 46.0 %   MCV 90.3 78.0 - 100.0 fL   MCH 30.3 26.0 - 34.0 pg   MCHC 33.6 30.0 - 36.0 g/dL   RDW 12.2 11.5 - 15.5 %   Platelets 267 150 - 400 K/uL  I-Stat beta hCG blood, ED     Status: None   Collection Time: 11/12/17 10:34 PM  Result Value  Ref Range   I-stat hCG, quantitative <5.0 <5 mIU/mL   Comment 3            Comment:   GEST. AGE      CONC.  (mIU/mL)   <=1 WEEK        5 - 50     2 WEEKS       50 - 500     3 WEEKS       100 - 10,000     4 WEEKS     1,000 - 30,000        FEMALE AND NON-PREGNANT FEMALE:     LESS THAN 5 mIU/mL   Urinalysis, Routine  w reflex microscopic     Status: None   Collection Time: 11/13/17  3:58 AM  Result Value Ref Range   Color, Urine YELLOW YELLOW   APPearance CLEAR CLEAR   Specific Gravity, Urine 1.012 1.005 - 1.030   pH 6.0 5.0 - 8.0   Glucose, UA NEGATIVE NEGATIVE mg/dL   Hgb urine dipstick NEGATIVE NEGATIVE   Bilirubin Urine NEGATIVE NEGATIVE   Ketones, ur NEGATIVE NEGATIVE mg/dL   Protein, ur NEGATIVE NEGATIVE mg/dL   Nitrite NEGATIVE NEGATIVE   Leukocytes, UA NEGATIVE NEGATIVE  Comprehensive metabolic panel     Status: Abnormal   Collection Time: 11/13/17  9:10 AM  Result Value Ref Range   Sodium 139 135 - 145 mmol/L   Potassium 3.6 3.5 - 5.1 mmol/L   Chloride 107 101 - 111 mmol/L   CO2 25 22 - 32 mmol/L   Glucose, Bld 86 65 - 99 mg/dL   BUN 9 6 - 20 mg/dL   Creatinine, Ser 0.65 0.44 - 1.00 mg/dL   Calcium 8.6 (L) 8.9 - 10.3 mg/dL   Total Protein 6.7 6.5 - 8.1 g/dL   Albumin 3.8 3.5 - 5.0 g/dL   AST 728 (H) 15 - 41 U/L   ALT 717 (H) 14 - 54 U/L   Alkaline Phosphatase 93 38 - 126 U/L   Total Bilirubin 2.5 (H) 0.3 - 1.2 mg/dL   GFR calc non Af Amer >60 >60 mL/min   GFR calc Af Amer >60 >60 mL/min    Comment: (NOTE) The eGFR has been calculated using the CKD EPI equation. This calculation has not been validated in all clinical situations. eGFR's persistently <60 mL/min signify possible Chronic Kidney Disease.    Anion gap 7 5 - 15   US Abdomen Complete  Result Date: 11/13/2017 CLINICAL DATA:  Intermittent RIGHT upper quadrant pain for months. Elevated liver function tests. EXAM: ABDOMEN ULTRASOUND COMPLETE COMPARISON:  None. FINDINGS: Gallbladder: Multiple echogenic  gallstones measuring to 14 mm with acoustic shadowing. No gallbladder wall thickening or pericholecystic fluid. No sonographic Murphy's sign elicited. Common bile duct: Diameter: 8 mm proximally, 5 mm distally. No sonographic findings of choledocholithiasis. Liver: No focal lesion identified. Within normal limits in parenchymal echogenicity. Portal vein is patent on color Doppler imaging with normal direction of blood flow towards the liver. IVC: No abnormality visualized. Pancreas: Visualized portion unremarkable. Spleen: Size and appearance within normal limits. Right Kidney: Length: 9.1 cm. Echogenicity within normal limits. No mass or hydronephrosis visualized. Left Kidney: Length: 9.8 cm. Echogenicity within normal limits. No mass or hydronephrosis visualized. Abdominal aorta: No aneurysm visualized. Other findings: None. IMPRESSION: 1. Cholelithiasis without sonographic findings of acute cholecystitis. 2. Dilated Common bile duct 8 mm proximally, associated with recently passed stones. Electronically Signed   By: Elon Alas M.D.   On: 11/13/2017 04:33   Mr Abdomen Mrcp Wo Contrast  Result Date: 11/13/2017 CLINICAL DATA:  Intermittent right upper quadrant pain with elevated LFTs. Cholelithiasis on ultrasound of biliary dilatation. EXAM: MRI ABDOMEN WITHOUT CONTRAST  (INCLUDING MRCP) TECHNIQUE: Multiplanar multisequence MR imaging of the abdomen was performed. Heavily T2-weighted images of the biliary and pancreatic ducts were obtained, and three-dimensional MRCP images were rendered by post processing. COMPARISON:  Ultrasound exam earlier the same day. FINDINGS: Lower chest:  Unremarkable. Hepatobiliary: Unremarkable noncontrast appearance. Gallbladder is distended with multiple stones evident ranging in size from 17 mm diameter down to stones in the fundal region measuring only a couple mm diameter. No gallbladder wall thickening or  pericholecystic fluid. Slight prominence intrahepatic bile ducts  with extrahepatic common duct measuring 7 mm diameter. Common bile duct in the head of the pancreas measures 3 mm diameter. Pancreas: Unremarkable. Spleen: Normal. Adrenals/Urinary Tract: Adrenal glands unremarkable. Kidneys have normal noncontrast MR appearance. Stomach/Bowel: Stomach is nondistended. No gastric wall thickening. No evidence of outlet obstruction. Duodenum is normally positioned as is the ligament of Treitz. No small bowel or colonic dilatation within the visualized abdomen. Vascular/Lymphatic: No abdominal aortic aneurysm. There is no gastrohepatic or hepatoduodenal ligament lymphadenopathy. No intraperitoneal or retroperitoneal lymphadenopathy. Other: No intraperitoneal free fluid. Musculoskeletal: No abnormal marrow signal within the visualized bony anatomy. IMPRESSION: 1. Cholelithiasis without choledocholithiasis. Common duct is borderline distended at 7 mm diameter although common bile duct diameter in the head of the pancreas is normal at 3 mm. Electronically Signed   By: Misty Stanley M.D.   On: 11/13/2017 11:41   Mr 3d Recon At Scanner  Result Date: 11/13/2017 CLINICAL DATA:  Intermittent right upper quadrant pain with elevated LFTs. Cholelithiasis on ultrasound of biliary dilatation. EXAM: MRI ABDOMEN WITHOUT CONTRAST  (INCLUDING MRCP) TECHNIQUE: Multiplanar multisequence MR imaging of the abdomen was performed. Heavily T2-weighted images of the biliary and pancreatic ducts were obtained, and three-dimensional MRCP images were rendered by post processing. COMPARISON:  Ultrasound exam earlier the same day. FINDINGS: Lower chest:  Unremarkable. Hepatobiliary: Unremarkable noncontrast appearance. Gallbladder is distended with multiple stones evident ranging in size from 17 mm diameter down to stones in the fundal region measuring only a couple mm diameter. No gallbladder wall thickening or pericholecystic fluid. Slight prominence intrahepatic bile ducts with extrahepatic common duct  measuring 7 mm diameter. Common bile duct in the head of the pancreas measures 3 mm diameter. Pancreas: Unremarkable. Spleen: Normal. Adrenals/Urinary Tract: Adrenal glands unremarkable. Kidneys have normal noncontrast MR appearance. Stomach/Bowel: Stomach is nondistended. No gastric wall thickening. No evidence of outlet obstruction. Duodenum is normally positioned as is the ligament of Treitz. No small bowel or colonic dilatation within the visualized abdomen. Vascular/Lymphatic: No abdominal aortic aneurysm. There is no gastrohepatic or hepatoduodenal ligament lymphadenopathy. No intraperitoneal or retroperitoneal lymphadenopathy. Other: No intraperitoneal free fluid. Musculoskeletal: No abnormal marrow signal within the visualized bony anatomy. IMPRESSION: 1. Cholelithiasis without choledocholithiasis. Common duct is borderline distended at 7 mm diameter although common bile duct diameter in the head of the pancreas is normal at 3 mm. Electronically Signed   By: Misty Stanley M.D.   On: 11/13/2017 11:41      Assessment/Plan  Cholelithiasis Transaminitis Hyperbilirubinemia - MRCP today was negative for choledocholithiasis - OR today for lap chole - Discussed risks of surgery and answered all questions. Friend at bedside.  Will plan OR today.   Kalman Drape, [facility] 11/13/2017, 1:10 PM Pager: [phone] Consults: [phone] Mon-Fri 7:00 am-4:30 pm Sat-Sun 7:00 am-11:30 am

## 2017-11-13 NOTE — Progress Notes (Signed)
This is a no charge note  Pending admission per PA, Allyne GeeSanders  21 year old lady without significant past medical history, who presents with epigastric abdominal pain, found to have abnormal liver function. Abdominal ultrasound showed gallstone, with common bile duct dilation 8 mm. Dr. Rhea BeltonPyrtle of GI was consulted by EDP, recommended to get MRCP. If MRCP is negative, will need to get surgeon consultation. Patient is admitted to MedSurg bed as inpatient.   Lorretta HarpXilin Makaio Mach, MD  [facility] Pager [phone]  If 7PM-7AM, please contact night-coverage [email or web address] Password [email or web address] 11/13/2017, 6:23 AM

## 2017-11-13 NOTE — Anesthesia Procedure Notes (Addendum)

## 2017-11-13 NOTE — Anesthesia Preprocedure Evaluation (Signed)
Anesthesia Evaluation  Patient identified by MRN, date of birth, ID band Patient awake    Reviewed: Allergy & Precautions, NPO status , Patient's Chart, lab work & pertinent test results  History of Anesthesia Complications Negative for: history of anesthetic complications  Airway Mallampati: II  TM Distance: >3 FB Neck ROM: Full    Dental  (+) Teeth Intact   Pulmonary neg pulmonary ROS,    breath sounds clear to auscultation       Cardiovascular negative cardio ROS   Rhythm:Regular     Neuro/Psych negative neurological ROS  negative psych ROS   GI/Hepatic Neg liver ROS, GERD  ,cholelithiasis   Endo/Other  negative endocrine ROS  Renal/GU negative Renal ROS     Musculoskeletal   Abdominal   Peds  Hematology negative hematology ROS (+)   Anesthesia Other Findings   Reproductive/Obstetrics                             Anesthesia Physical Anesthesia Plan  ASA: I  Anesthesia Plan: General   Post-op Pain Management:    Induction: Intravenous  PONV Risk Score and Plan: 3 and Dexamethasone, Treatment may vary due to age or medical condition and Ondansetron  Airway Management Planned: Oral ETT  Additional Equipment: None  Intra-op Plan:   Post-operative Plan: Extubation in OR  Informed Consent: I have reviewed the patients History and Physical, chart, labs and discussed the procedure including the risks, benefits and alternatives for the proposed anesthesia with the patient or authorized representative who has indicated his/her understanding and acceptance.   Dental advisory given  Plan Discussed with: CRNA and Surgeon  Anesthesia Plan Comments:         Anesthesia Quick Evaluation

## 2017-11-13 NOTE — Progress Notes (Signed)
Lap chole operative note reviewed. Recommend repeat LFTs in 1 month. GI signing off. Please call if needed.

## 2017-11-13 NOTE — Transfer of Care (Signed)
Immediate Anesthesia Transfer of Care Note  Patient: Brianna FlavorsHolly Holland  Procedure(s) Performed: LAPAROSCOPIC CHOLECYSTECTOMY (N/A )  Patient Location: PACU  Anesthesia Type:General  Level of Consciousness: awake, alert , oriented and patient cooperative  Airway & Oxygen Therapy: Patient Spontanous Breathing and Patient connected to face mask oxygen  Post-op Assessment: Report given to RN, Post -op Vital signs reviewed and stable and Patient moving all extremities X 4  Post vital signs: stable  Last Vitals:  Vitals:   11/13/17 0900 11/13/17 1349  BP: 105/62 114/69  Pulse: 60 77  Resp: 16 18  Temp: 36.5 C 36.4 C  SpO2: 100% 100%    Last Pain:  Vitals:   11/13/17 1349  TempSrc: Oral  PainSc:          Complications: No apparent anesthesia complications

## 2017-11-13 NOTE — Consult Note (Signed)
Referring Provider: [facility] Primary Care Physician:  Patient, No Pcp Per Primary Gastroenterologist:   unassigned  Reason for Consultation: abdominal pain, abnormal liver chemistries   Attending physician's note   I have taken a history, examined the patient and reviewed the chart. I agree with the Advanced Practitioner's note, impression and recommendations. Epigastric and chest pain radiating to back. Cholelithiasis, elevated transaminases, mildly elevated t bili and a mildly dilated CBD on [place]. R/O choledocholithiasis. She may have passed a CBD stone. MRCP today. If MRCP negative recommend lap chole and IOC. If MRCP shows a stone then ERCP.  Surgcal consult today.   Claudette Head, MD Clementeen Graham [phone] Mon-Fri 8a-5p [phone] after 5p, weekends, holidays   ASSESSMENT AND PLAN:   21 year old female with several month history of intermittent chest/epigastric pain radiating through to back.  Escalation of pain last night prompted ED visit or patient was found to have markedly elevated transaminases and mild hyperbilirubinemia in setting of cholelithiasis and proximal CBD dilation suggesting passed stone. No pain at present, last dose of morphine was 7 hours ago -Awaiting MRCP to be done with am. If negative then needs surgical consultation for cholecystectomy. If MRCP positive then needs ERCP -am LFTs, CBC  HPI: Brianna Holland is a healthy 21 y.o. female who presented to the ED last night for evaluation of epigastric pain.  Since the summer she has been having intermittent episodes of chest / epigastric pain radiating through to her back once or twice a month.  No associated cough or SOB. She  has correlated the pain with food, especially certain types of food.  Generally when the pain occurs she takes a couple of aspirin and pain subsides.  Episodes have become more frequent as of late.  Yesterday she had a severe episode of pain, took aspirin without any relief.  She had eaten spinach  salad, felt spinach did not digest and decided to make herself vomit to remove the spinach and hopefully relieve abdominal pain.  She had chills yesterday.  No hematemesis.  Bowels are normal.  No melena.   Brianna Holland has a history of GERD.  She used to take medication for GERD but then learned to manage it with diet and is now pretty much asymptomatic  Emergency department : Vitals were stable, afebrile liver chemistri abnormal withes AST 395, ALT 272.  Total bilirubin 1.7.  Normal alkaline phosphatase.  WBC mildly elevated at 11.5.  Normal hemoglobin.  Ultrasound shows cholelithiasis without acute cholecystitis.  Common bile duct dilated 8 mm approximately, associated with recently passed stones.   History reviewed. No pertinent past medical history.  History reviewed. No pertinent surgical history.  Prior to Admission medications   Medication Sig Start Date End Date Taking? Authorizing Provider  Cetirizine HCl (ZYRTEC ALLERGY) 10 MG CAPS Take 1 capsule (10 mg total) by mouth daily. 03/19/17  Yes Barnett Abu, [place] D, PA-C  MELATONIN PO Take 1 tablet by mouth at bedtime as needed (sleep).   Yes [provider]  norethindrone (MICRONOR,CAMILA,ERRIN) 0.35 MG tablet Take 1 tablet by mouth daily.   Yes [provider]    Current Facility-Administered Medications  Medication Dose Route Frequency Provider Last Rate Last Dose  . 0.9 %  sodium chloride infusion   Intravenous Continuous Lorretta Harp, MD 100 mL/hr at 11/13/17 0724    . morphine 2 MG/ML injection 2 mg  2 mg Intravenous Q4H PRN Lorretta Harp, MD      . ondansetron [facility]) injection 4 mg  4  mg Intravenous Q8H PRN Lorretta HarpNiu, Xilin, MD        Allergies as of 11/12/2017  . (No Known Allergies)    Family History  Problem Relation Age of Onset  . Cancer Father        Prostate  . Hypertension Father     Social History   Socioeconomic History  . Marital status: Single    Spouse name: Not on file  . Number of children: Not on  file  . Years of education: Not on file  . Highest education level: Not on file  Social Needs  . Financial resource strain: Not on file  . Food insecurity - worry: Not on file  . Food insecurity - inability: Not on file  . Transportation needs - medical: Not on file  . Transportation needs - non-medical: Not on file  Occupational History  . Not on file  Tobacco Use  . Smoking status: Never Smoker  . Smokeless tobacco: Never Used  Substance and Sexual Activity  . Alcohol use: No  . Drug use: No  . Sexual activity: Not on file  Other Topics Concern  . Not on file  Social History Narrative  . Not on file    Review of Systems: All systems reviewed and negative except where noted in HPI.  Physical Exam: Vital signs in last 24 hours: Temp:  [97.7 F (36.5 C)-98.7 F (37.1 C)] 97.7 F (36.5 C) (11/30 0900) Pulse Rate:  [60-99] 60 (11/30 0900) Resp:  [16-18] 16 (11/30 0900) BP: (96-125)/(60-75) 105/62 (11/30 0900) SpO2:  [99 %-100 %] 100 % (11/30 0900) Weight:  [170 lb (77.1 kg)] 170 lb (77.1 kg) (11/30 0900)   General:   Alert, well-developed, white female in NAD Psych:  Pleasant, cooperative. Normal mood and affect. Eyes:  Pupils equal, sclera clear, no icterus.   Conjunctiva pink. Ears:  Normal auditory acuity. Nose:  No deformity, discharge,  or lesions. Neck:  Supple; no masses Lungs:  Clear throughout to auscultation.   No wheezes, crackles, or rhonchi.  Heart:  Regular rate and rhythm; no murmurs, no edema Abdomen:  Soft, non-distended, nontender, BS active, no palp mass    Rectal:  Deferred  Msk:  Symmetrical without gross deformities. . Pulses:  Normal pulses noted. Neurologic:  Alert and  oriented x4;  grossly normal neurologically. Skin:  Intact without significant lesions or rashes..   Intake/Output from previous day: No intake/output data recorded. Intake/Output this shift: No intake/output data recorded.  Lab Results: Recent Labs    11/12/17 2221    WBC 11.5*  HGB 12.8  HCT 38.1  PLT 267   BMET Recent Labs    11/12/17 2221  NA 136  K 3.6  CL 104  CO2 26  GLUCOSE 115*  BUN 13  CREATININE 0.66  CALCIUM 9.1   LFT Recent Labs    11/12/17 2221  PROT 7.3  ALBUMIN 4.2  AST 395*  ALT 272*  ALKPHOS 74  BILITOT 1.7*    Studies/Results: [place] Abdomen Complete  Result Date: 11/13/2017 CLINICAL DATA:  Intermittent RIGHT upper quadrant pain for months. Elevated liver function tests. EXAM: ABDOMEN ULTRASOUND COMPLETE COMPARISON:  None. FINDINGS: Gallbladder: Multiple echogenic gallstones measuring to 14 mm with acoustic shadowing. No gallbladder wall thickening or pericholecystic fluid. No sonographic Murphy's sign elicited. Common bile duct: Diameter: 8 mm proximally, 5 mm distally. No sonographic findings of choledocholithiasis. Liver: No focal lesion identified. Within normal limits in parenchymal echogenicity. Portal vein is patent on color Doppler  imaging with normal direction of blood flow towards the liver. IVC: No abnormality visualized. Pancreas: Visualized portion unremarkable. Spleen: Size and appearance within normal limits. Right Kidney: Length: 9.1 cm. Echogenicity within normal limits. No mass or hydronephrosis visualized. Left Kidney: Length: 9.8 cm. Echogenicity within normal limits. No mass or hydronephrosis visualized. Abdominal aorta: No aneurysm visualized. Other findings: None. IMPRESSION: 1. Cholelithiasis without sonographic findings of acute cholecystitis. 2. Dilated Common bile duct 8 mm proximally, associated with recently passed stones. Electronically Signed   By: Awilda Metroourtnay  Bloomer M.D.   On: 11/13/2017 04:33     Willette Clusteraula Guenther, NP-C @  11/13/2017, 9:47 AM  Pager number [phone]

## 2017-11-13 NOTE — ED Notes (Signed)
Attempted to call report, no answer on nurse's phone.

## 2017-11-13 NOTE — Op Note (Signed)
PATIENT:  Brianna FlavorsHolly Costilow  21 y.o. female  PRE-OPERATIVE DIAGNOSIS:  cholelithiasis  POST-OPERATIVE DIAGNOSIS:  cholelithiasis  PROCEDURE:  Procedure(s): LAPAROSCOPIC CHOLECYSTECTOMY   SURGEON:  Surgeon(s): Deedra Pro, De BlanchLuke Aaron, MD  ASSISTANT: none  ANESTHESIA:   local and general  Indications for procedure: Brianna FlavorsHolly Blacklock is a 21 y.o. female with symptoms of Abdominal pain and Nausea and vomiting consistent with gallbladder disease, Confirmed by Ultrasound.  Description of procedure: The patient was brought into the operative suite, placed supine. Anesthesia was administered with endotracheal tube. Patient was strapped in place and foot board was secured. All pressure points were offloaded by foam padding. The patient was prepped and draped in the usual sterile fashion.  A small incision was made to the right of the umbilicus. A 5mm trocar was inserted into the peritoneal cavity with optical entry. Pneumoperitoneum was applied with high flow low pressure. 2 5mm trocars were placed in the RUQ. A 12mm trocar was placed in the subxiphoid space. All trocars sites were first anesthesized with marcaine with epinephrine in the subcutaneous and preperitoneal layers. Next the patient was placed in reverse trendelenberg. The gallbladder was white in color.  The gallbladder was retracted cephalad and lateral. The peritoneum was reflected off the infundibulum working lateral to medial. The cystic duct and cystic artery were identified and further dissection revealed a critical view. The cystic duct and cystic artery were doubly clipped and ligated.   The gallbladder was removed off the liver bed with cautery. The Gallbladder was placed in a specimen bag. The gallbladder fossa was irrigated and hemostasis was applied with cautery. The gallbladder was removed via the 12mm trocar. The fascial defect was closed with interrupted 0 vicryl suture via laparoscopic trans-fascial suture passer. Pneumoperitoneum  was removed, all trocar were removed. All incisions were closed with 4-0 monocryl subcuticular stitch. The patient woke from anesthesia and was brought to PACU in stable condition. All counts were correct  Findings: large gallstones, normal ductal   Specimen: gallbladder  Blood loss: 20 ml  Local anesthesia: 40 ml marcaine  Complications: none  PLAN OF CARE: Admit for overnight observation  PATIENT DISPOSITION:  PACU - hemodynamically stable.  Feliciana RossettiLuke Nayali Talerico, M.D. General, Bariatric, & Minimally Invasive Surgery [facility]

## 2017-11-14 ENCOUNTER — Encounter (HOSPITAL_COMMUNITY): Payer: Self-pay | Admitting: General Surgery

## 2017-11-14 DIAGNOSIS — R109 Unspecified abdominal pain: Secondary | ICD-10-CM | POA: Diagnosis present

## 2017-11-14 LAB — COMPREHENSIVE METABOLIC PANEL
ALK PHOS: 106 U/L (ref 38–126)
ALT: 548 U/L — ABNORMAL HIGH (ref 14–54)
ANION GAP: 6 (ref 5–15)
AST: 240 U/L — ABNORMAL HIGH (ref 15–41)
Albumin: 3.6 g/dL (ref 3.5–5.0)
BUN: 8 mg/dL (ref 6–20)
CALCIUM: 8.6 mg/dL — AB (ref 8.9–10.3)
CHLORIDE: 104 mmol/L (ref 101–111)
CO2: 25 mmol/L (ref 22–32)
Creatinine, Ser: 0.63 mg/dL (ref 0.44–1.00)
GFR calc non Af Amer: >60 mL/min (ref >60)
Glucose, Bld: 144 mg/dL — ABNORMAL HIGH (ref 65–99)
POTASSIUM: 4 mmol/L (ref 3.5–5.1)
SODIUM: 135 mmol/L (ref 135–145)
Total Bilirubin: 0.6 mg/dL (ref 0.3–1.2)
Total Protein: 6.6 g/dL (ref 6.5–8.1)

## 2017-11-14 LAB — CBC
HEMATOCRIT: 36.9 % (ref 36.0–46.0)
HEMOGLOBIN: 12.1 g/dL (ref 12.0–15.0)
MCH: 30 pg (ref 26.0–34.0)
MCHC: 32.8 g/dL (ref 30.0–36.0)
MCV: 91.3 fL (ref 78.0–100.0)
Platelets: 248 10*3/uL (ref 150–400)
RBC: 4.04 MIL/uL (ref 3.87–5.11)
RDW: 12.4 % (ref 11.5–15.5)
WBC: 11.8 10*3/uL — ABNORMAL HIGH (ref 4.0–10.5)

## 2017-11-14 LAB — HIV ANTIBODY (ROUTINE TESTING W REFLEX): HIV SCREEN 4TH GENERATION: NONREACTIVE

## 2017-11-14 MED ORDER — HYDROCODONE-ACETAMINOPHEN 5-325 MG PO TABS: 1.0000 | ORAL_TABLET | ORAL | 0 refills | Status: AC | PRN

## 2017-11-14 NOTE — Discharge Summary (Signed)
Physician Discharge Summary  Patient ID: Brianna FlavorsHolly Lundblad MRN: [id number] DOB/AGE: 21/01/1996 21 y.o.  Admit date: 11/13/2017 Discharge date: 11/14/2017  Admission Diagnoses:  cholecystitis  Discharge Diagnoses:  same  Active Problems:   Transaminitis   Overweight (BMI 25.0-29.9)   GERD (gastroesophageal reflux disease)   Surgery:  Lap chole by Dr. Sheliah HatchKinsinger  Discharged Condition: improved  Hospital Course:   Had surgery on Friday and ready for discharge on Saturday  Consults: none  Significant Diagnostic Studies: path pending    Discharge Exam: Blood pressure 111/73, pulse 67, temperature 98.2 F (36.8 C), temperature source Oral, resp. rate 18, height 5\' 4"  (1.626 m), weight 77.1 kg (170 lb), last menstrual period 10/08/2017, SpO2 98 %. Incisions OK-taking diet ok.    Disposition: 01-Home or Self Care  Discharge Instructions    Call MD for:  persistant nausea and vomiting   Complete by:  As directed    Call MD for:  redness, tenderness, or signs of infection (pain, swelling, redness, odor or green/yellow discharge around incision site)   Complete by:  As directed    Diet - low sodium heart healthy   Complete by:  As directed    Discharge instructions   Complete by:  As directed    Advance diet as tolerated. May shower at home as needed.   Increase activity slowly   Complete by:  As directed      Allergies as of 11/14/2017   No Known Allergies     Medication List    TAKE these medications   Cetirizine HCl 10 MG Caps Commonly known as:  ZYRTEC ALLERGY Take 1 capsule (10 mg total) by mouth daily.   HYDROcodone-acetaminophen 5-325 MG tablet Commonly known as:  NORCO/VICODIN Take 1 tablet by mouth every 4 (four) hours as needed for moderate pain.   MELATONIN PO Take 1 tablet by mouth at bedtime as needed (sleep).   norethindrone 0.35 MG tablet Commonly known as:  MICRONOR,CAMILA,ERRIN Take 1 tablet by mouth daily.      Follow-up Information    Kinsinger, De BlanchLuke Aaron, MD. Schedule an appointment as soon as possible for a visit in 3 week(s).   Specialty:  General Surgery Contact information: [address] [place] [address] [phone]           Signed: Valarie MerinoMatthew B Tamrah Victorino 11/14/2017, 9:53 AM

## 2017-11-14 NOTE — Discharge Instructions (Signed)

## 2017-11-14 NOTE — Progress Notes (Signed)
Discharge and medication instructions reviewed with patient and sister. Questions answered and both deny further questions. Sister is driving patient home. Lina SarBeth Lindamarie Maclachlan, RN

## 2017-12-14 ENCOUNTER — Telehealth: Payer: Self-pay

## 2017-12-14 DIAGNOSIS — R748 Abnormal levels of other serum enzymes: Secondary | ICD-10-CM

## 2017-12-14 NOTE — Telephone Encounter (Signed)
-----   Message from Jessee AversAmanda L Lewayne Pauley, [place] sent at 11/16/2017  8:48 AM EST ----- Patient needs LFT's 12/15/17 ----- Message ----- From: Meryl DareStark, Malcolm T, MD Sent: 11/13/2017   5:05 PM To: Jessee AversAmanda L Estrella Alcaraz, CMA  Please call pt to have her come in to our lab for LFTs in 1 month.  She had elevated LFTs in the hospital, likely passed a CBD stone, and she underwent lap chole on Friday.

## 2017-12-14 NOTE — Telephone Encounter (Signed)
Left a message for patient to return my call. 

## 2017-12-16 NOTE — Telephone Encounter (Signed)
Left a message for patient to return my call. Letter to be mailed to patient.  

## 2018-05-17 ENCOUNTER — Other Ambulatory Visit: Payer: Self-pay | Admitting: Physician Assistant

## 2018-05-17 DIAGNOSIS — J309 Allergic rhinitis, unspecified: Secondary | ICD-10-CM

## 2018-05-19 NOTE — Telephone Encounter (Signed)
Zyrtec 10 mg refill request  LOV 03/19/17 with [name]     Needs appt.   Not been seen since this appt.  [facility] [address], [place] - [address]

## 2019-07-10 IMAGING — US US ABDOMEN COMPLETE
1 series · 13 of 25 positions shown · non-contrast
Comparison: None.

CLINICAL DATA: Intermittent RIGHT upper quadrant pain for months.
Elevated liver function tests.

EXAM:
ABDOMEN ULTRASOUND COMPLETE

[Series 1: us abdomen complete · 0.23mm/px · 13 of 100 slices shown]
[im 1/100]
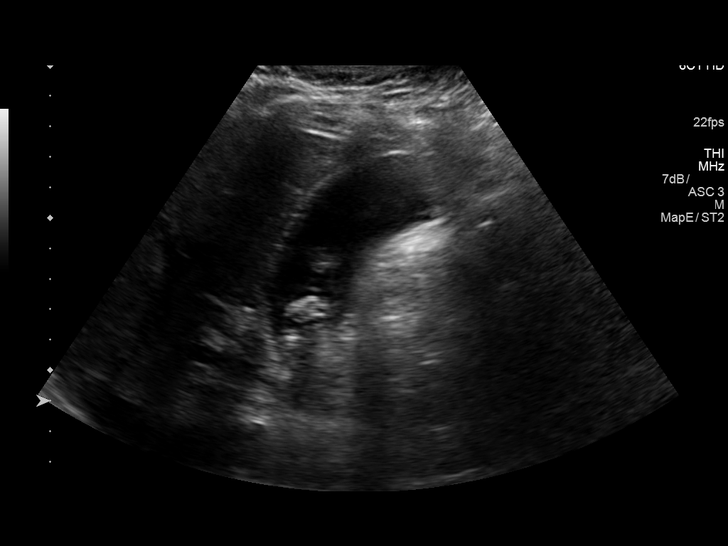
[im 9/100]
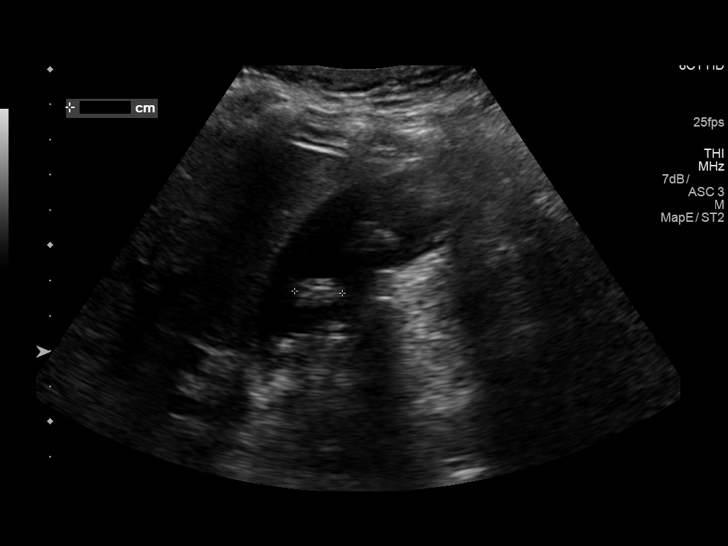
[im 17/100]
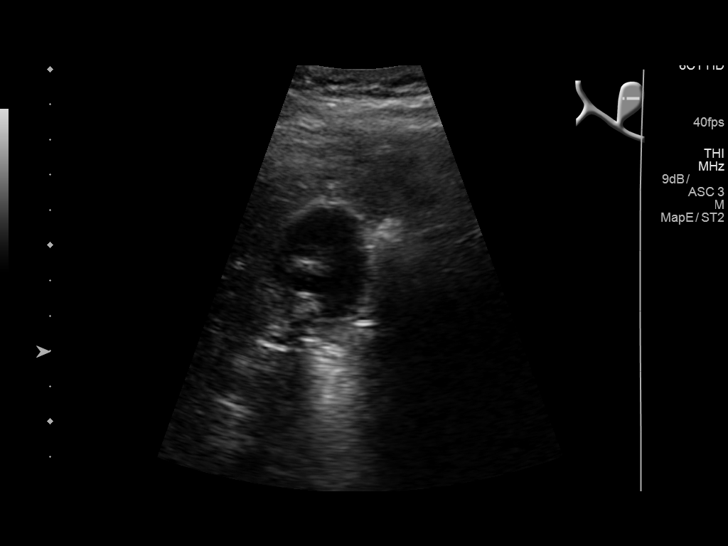
[im 25/100]
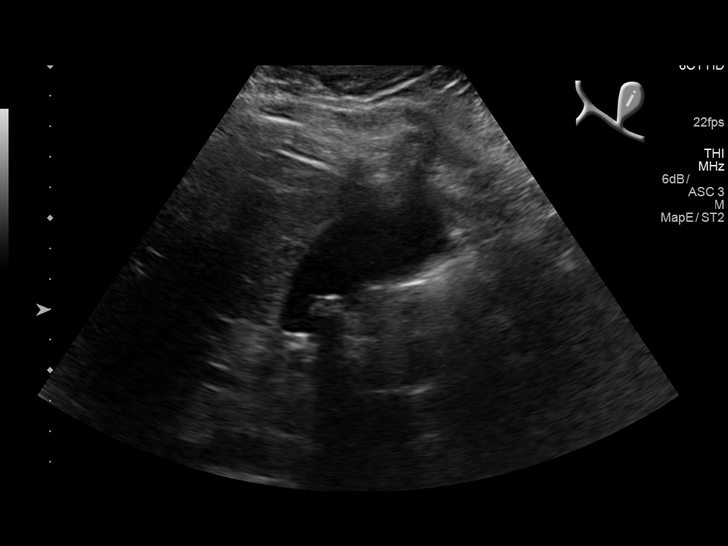
[im 34/100]
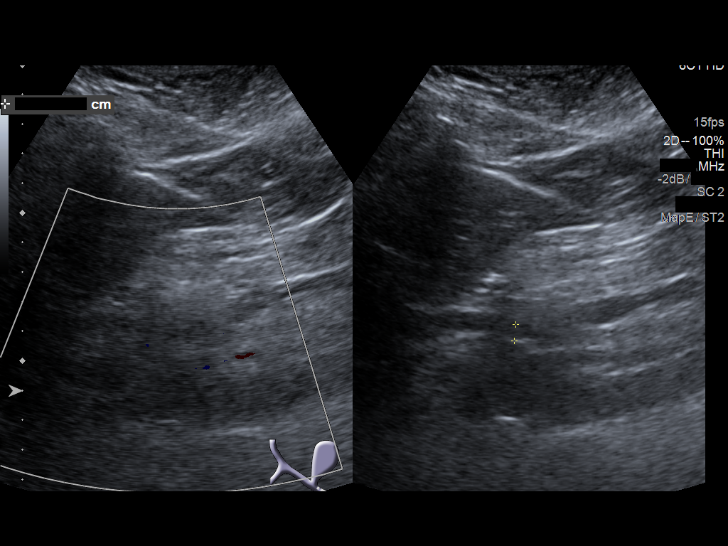
[im 42/100]
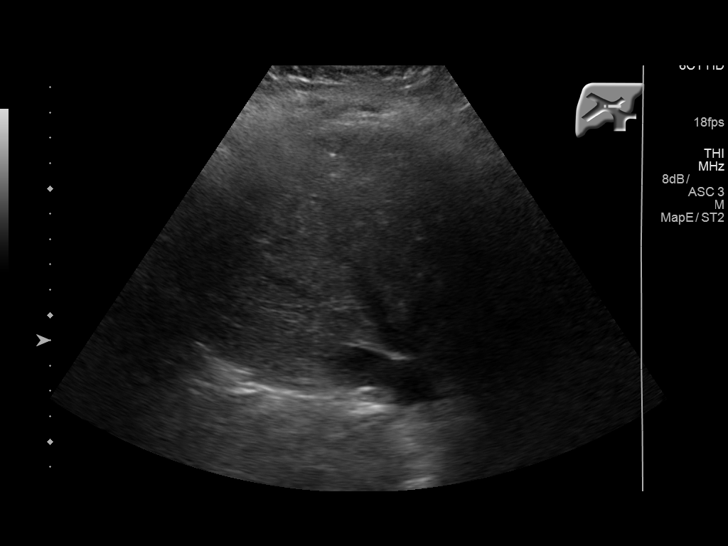
[im 50/100]
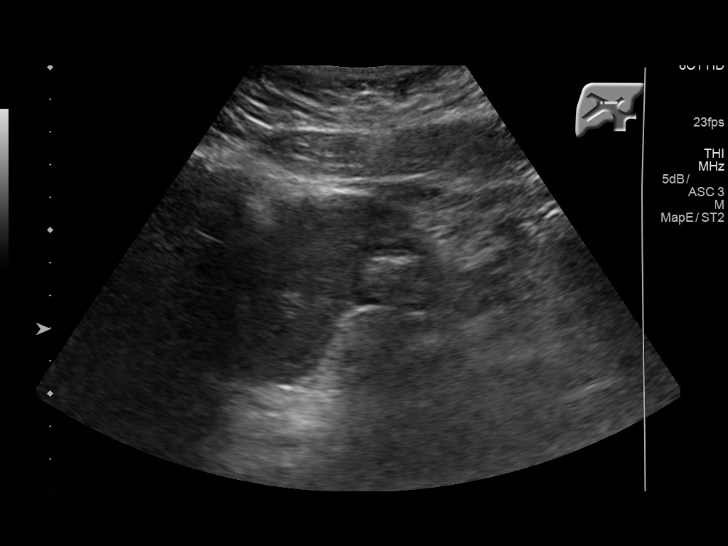
[im 58/100]
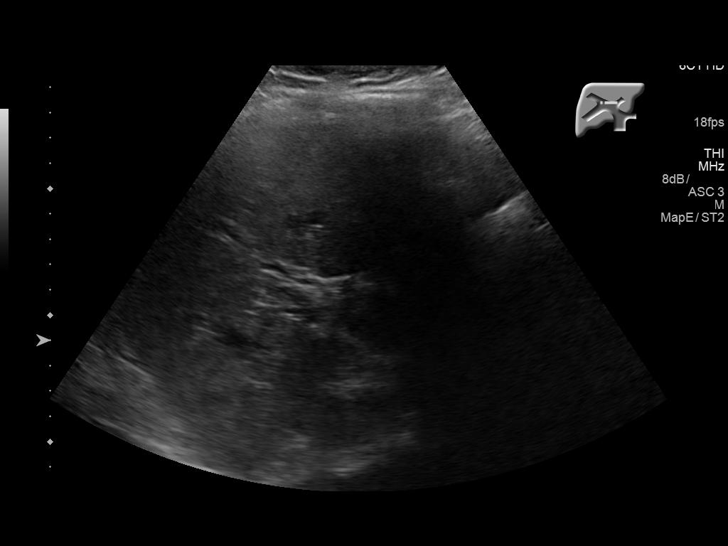
[im 67/100]
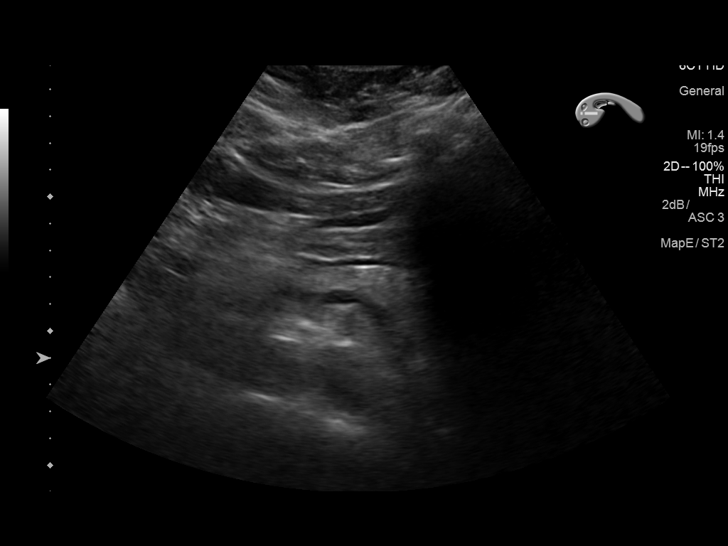
[im 75/100]
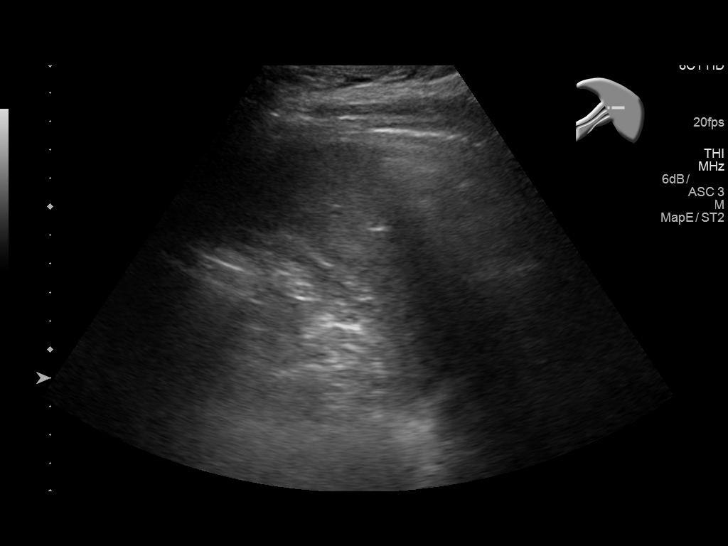
[im 83/100]
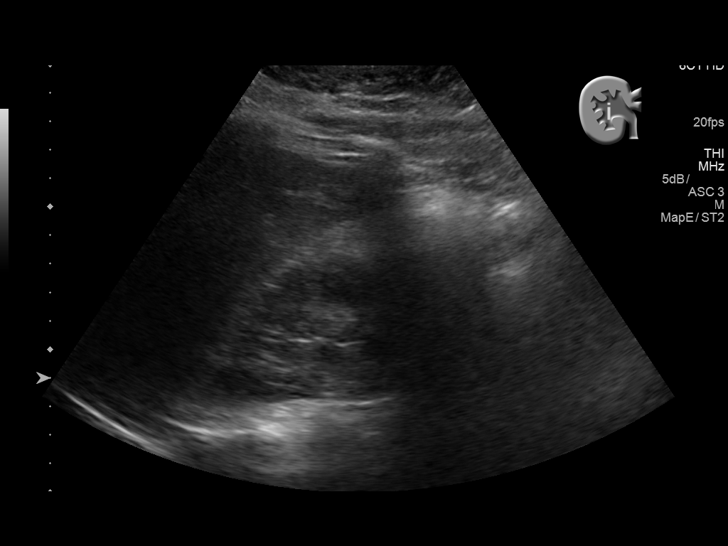
[im 91/100]
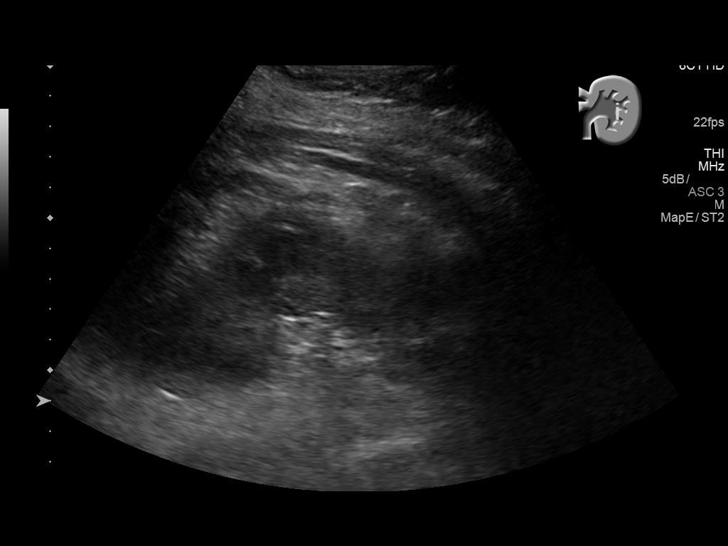
[im 100/100]
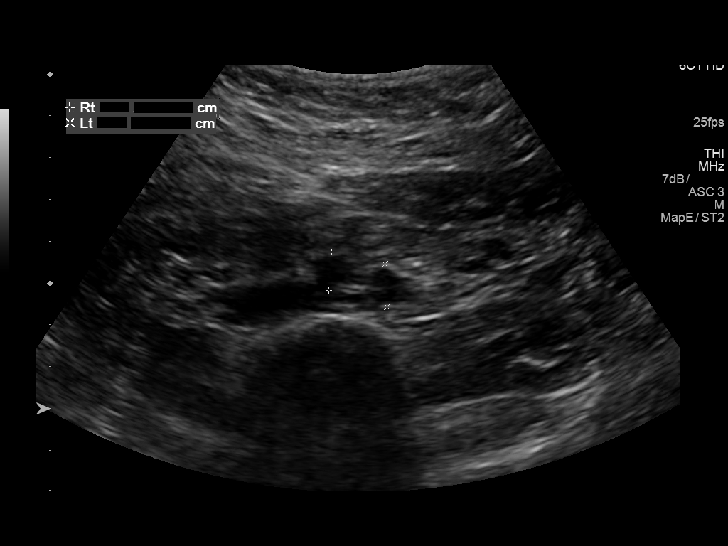

[13 of 25 positions shown; findings below may reference images not displayed]

FINDINGS: Gallbladder: Multiple echogenic gallstones measuring to 14 mm with
acoustic shadowing. No gallbladder wall thickening or
pericholecystic fluid. No sonographic Murphy's sign elicited.

Common bile duct: Diameter: 8 mm proximally, 5 mm distally. No
sonographic findings of choledocholithiasis.

Liver: No focal lesion identified. Within normal limits in
parenchymal echogenicity. Portal vein is patent on color Doppler
imaging with normal direction of blood flow towards the liver.

IVC: No abnormality visualized.

Pancreas: Visualized portion unremarkable.

Spleen: Size and appearance within normal limits.

Right Kidney: Length: 9.1 cm. Echogenicity within normal limits. No
mass or hydronephrosis visualized.

Left Kidney: Length: 9.8 cm. Echogenicity within normal limits. No
mass or hydronephrosis visualized.

Abdominal aorta: No aneurysm visualized.

Other findings: None.
IMPRESSION: 1. Cholelithiasis without sonographic findings of acute
cholecystitis.
2. Dilated Common bile duct 8 mm proximally, associated with
recently passed stones.

## 2019-11-16 ENCOUNTER — Other Ambulatory Visit: Payer: Self-pay

## 2019-11-16 DIAGNOSIS — Z20822 Contact with and (suspected) exposure to covid-19: Secondary | ICD-10-CM

## 2019-11-17 LAB — NOVEL CORONAVIRUS, NAA: SARS-CoV-2, NAA: DETECTED — AB

## 2019-11-21 ENCOUNTER — Telehealth: Payer: Self-pay | Admitting: General Practice

## 2019-11-21 NOTE — Telephone Encounter (Signed)
Spoke with patient about result and guidelines

## 2019-11-21 NOTE — Telephone Encounter (Signed)
Patient is calling to get covid test results. Patient is positive, let patient know she will get a call back from nurse Call back [phone]

## 2020-02-22 ENCOUNTER — Ambulatory Visit: Payer: BC Managed Care – PPO | Attending: Internal Medicine

## 2020-02-22 ENCOUNTER — Ambulatory Visit: Payer: BLUE CROSS/BLUE SHIELD

## 2020-02-22 DIAGNOSIS — Z20822 Contact with and (suspected) exposure to covid-19: Secondary | ICD-10-CM

## 2020-02-23 LAB — NOVEL CORONAVIRUS, NAA: SARS-CoV-2, NAA: NOT DETECTED
